# Patient Record
Sex: Female | Born: 2000 | Race: White | Hispanic: No | Marital: Single | Smoking: Never smoker
Health system: Southern US, Community
[De-identification: ages and names within clinical notes are randomized; demographics above are authoritative.]

## PROBLEM LIST (undated history)

## (undated) DIAGNOSIS — D649 Anemia, unspecified: Secondary | ICD-10-CM

## (undated) DIAGNOSIS — M549 Dorsalgia, unspecified: Secondary | ICD-10-CM

## (undated) HISTORY — DX: Dorsalgia, unspecified: M54.9

## (undated) HISTORY — DX: Anemia, unspecified: D64.9

---
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
## 2022-09-20 ENCOUNTER — Ambulatory Visit (INDEPENDENT_AMBULATORY_CARE_PROVIDER_SITE_OTHER): Payer: PRIVATE HEALTH INSURANCE | Admitting: Nurse Practitioner

## 2022-09-20 ENCOUNTER — Encounter: Payer: Self-pay | Admitting: Nurse Practitioner

## 2022-09-20 VITALS — BP 116/76 | HR 81 | Temp 98.5°F | Ht 65.0 in | Wt 246.0 lb

## 2022-09-20 DIAGNOSIS — Z0289 Encounter for other administrative examinations: Secondary | ICD-10-CM

## 2022-09-20 DIAGNOSIS — Z6841 Body Mass Index (BMI) 40.0 and over, adult: Secondary | ICD-10-CM | POA: Diagnosis not present

## 2022-09-20 DIAGNOSIS — R5383 Other fatigue: Secondary | ICD-10-CM | POA: Diagnosis not present

## 2022-09-20 NOTE — Progress Notes (Signed)
Office: [phone]  /  Fax: [phone]   Initial Visit  Jodi Gilbert was seen in clinic today to evaluate for obesity. She is interested in losing weight to improve overall health and reduce the risk of weight related complications. She presents today to review program treatment options, initial physical assessment, and evaluation.     She was referred by: Friend or Family  When asked what else they would like to accomplish? She states: Improve quality of life and Lose a target amount of weight : Goal weight:  <200 lbs  She is going to [employer] in [place].  She works as a Social worker.    Weight history:  She started gaining weight around the age of 45  When asked how has your weight affected you? She states: Having fatigue  Some associated conditions: none  Contributing factors: None  Weight promoting medications identified: None  Current nutrition plan: None  Current level of physical activity: works as a Social worker  Current or previous pharmacotherapy: None  Response to medication: Never tried medications   Past medical history includes:  History reviewed. No pertinent past medical history.   Objective:   BP 116/76   Pulse 81   Temp 98.5 F (36.9 C)   Ht 5\' 5"  (1.651 m)   Wt 246 lb (111.6 kg)   LMP 09/13/2022 (Approximate)   SpO2 96%   BMI 40.94 kg/m  She was weighed on the bioimpedance scale: Body mass index is 40.94 kg/m.  Peak Weight:251 lbs , Body Fat%:43.6%, Visceral Fat Rating:9, Weight trend over the last 12 months: Decreasing  General:  Alert, oriented and cooperative. Patient is in no acute distress.  Respiratory: Normal respiratory effort, no problems with respiration noted   Gait: able to ambulate independently  Mental Status: Normal mood and affect. Normal behavior. Normal judgment and thought content.   DIAGNOSTIC DATA REVIEWED:  BMET No results found for: "NA", "K", "CL", "CO2", "GLUCOSE", "BUN", "CREATININE", "CALCIUM",  "GFRNONAA", "GFRAA" No results found for: "HGBA1C" No results found for: "INSULIN" CBC No results found for: "WBC", "RBC", "HGB", "HCT", "PLT", "MCV", "MCH", "MCHC", "RDW" Iron/TIBC/Ferritin/ %Sat No results found for: "IRON", "TIBC", "FERRITIN", "IRONPCTSAT" Lipid Panel  No results found for: "CHOL", "TRIG", "HDL", "CHOLHDL", "VLDL", "LDLCALC", "LDLDIRECT" Hepatic Function Panel  No results found for: "PROT", "ALBUMIN", "AST", "ALT", "ALKPHOS", "BILITOT", "BILIDIR", "IBILI" No results found for: "TSH"   Assessment and Plan:   Other fatigue  Morbid obesity (HCC)  BMI 40.0-44.9, adult (HCC)        Obesity Treatment / Action Plan:  Patient will work on garnering support from family and friends to begin weight loss journey. Will work on eliminating or reducing the presence of highly palatable, calorie dense foods in the home. Will complete provided nutritional and psychosocial assessment questionnaire before the next appointment. Will be scheduled for indirect calorimetry to determine resting energy expenditure in a fasting state.  This will allow [place] to create a reduced calorie, high-protein meal plan to promote loss of fat mass while preserving muscle mass. Counseled on the health benefits of losing 5%-15% of total body weight. Was counseled on nutritional approaches to weight loss and benefits of reducing processed foods and consuming plant-based foods and high quality protein as part of nutritional weight management. Was counseled on pharmacotherapy and role as an adjunct in weight management.   Obesity Education Performed Today:  She was weighed on the bioimpedance scale and results were discussed and documented in the synopsis.  We discussed obesity  as a disease and the importance of a more detailed evaluation of all the factors contributing to the disease.  We discussed the importance of long term lifestyle changes which include nutrition, exercise and behavioral  modifications as well as the importance of customizing this to her specific health and social needs.  We discussed the benefits of reaching a healthier weight to alleviate the symptoms of existing conditions and reduce the risks of the biomechanical, metabolic and psychological effects of obesity.  Jodi Gilbert appears to be in the action stage of change and states they are ready to start intensive lifestyle modifications and behavioral modifications.  30 minutes was spent today on this visit including the above counseling, pre-visit chart review, and post-visit documentation.  Reviewed by clinician on day of visit: allergies, medications, problem list, medical history, surgical history, family history, social history, and previous encounter notes pertinent to obesity diagnosis.    Theodis Sato Ronith Berti FNP-C

## 2022-09-23 ENCOUNTER — Encounter (INDEPENDENT_AMBULATORY_CARE_PROVIDER_SITE_OTHER): Payer: Self-pay | Admitting: Family Medicine

## 2022-10-07 ENCOUNTER — Encounter: Payer: Self-pay | Admitting: Bariatrics

## 2022-10-07 ENCOUNTER — Ambulatory Visit (INDEPENDENT_AMBULATORY_CARE_PROVIDER_SITE_OTHER): Payer: PRIVATE HEALTH INSURANCE | Admitting: Bariatrics

## 2022-10-07 VITALS — BP 118/70 | HR 70 | Temp 97.9°F | Ht 65.0 in | Wt 246.0 lb

## 2022-10-07 DIAGNOSIS — Z6841 Body Mass Index (BMI) 40.0 and over, adult: Secondary | ICD-10-CM | POA: Diagnosis not present

## 2022-10-07 DIAGNOSIS — E559 Vitamin D deficiency, unspecified: Secondary | ICD-10-CM

## 2022-10-07 DIAGNOSIS — Z0000 Encounter for general adult medical examination without abnormal findings: Secondary | ICD-10-CM

## 2022-10-07 DIAGNOSIS — Z1331 Encounter for screening for depression: Secondary | ICD-10-CM

## 2022-10-07 DIAGNOSIS — R0602 Shortness of breath: Secondary | ICD-10-CM

## 2022-10-07 DIAGNOSIS — R7309 Other abnormal glucose: Secondary | ICD-10-CM | POA: Insufficient documentation

## 2022-10-07 DIAGNOSIS — R5383 Other fatigue: Secondary | ICD-10-CM

## 2022-10-11 ENCOUNTER — Encounter (INDEPENDENT_AMBULATORY_CARE_PROVIDER_SITE_OTHER): Payer: Self-pay | Admitting: Bariatrics

## 2022-10-11 DIAGNOSIS — R7303 Prediabetes: Secondary | ICD-10-CM | POA: Insufficient documentation

## 2022-10-11 NOTE — Progress Notes (Signed)
Chief Complaint:   OBESITY Tierney Wolden (MR# [id number]) is a 22 y.o. female who presents for evaluation and treatment of obesity and related comorbidities. Current BMI is Body mass index is 40.94 kg/m. Suheyla has been struggling with her weight for many years and has been unsuccessful in either losing weight, maintaining weight loss, or reaching her healthy weight goal.  Daphny is currently in the action stage of change and ready to dedicate time achieving and maintaining a healthier weight. Kimi is interested in becoming our patient and working on intensive lifestyle modifications including (but not limited to) diet and exercise for weight loss.  Patient states that she does not really like to cook as she has no time. She crave chocolate. She skips breakfast.   Layloni's habits were reviewed today and are as follows: her desired weight loss is 46 lbs, she has been heavy most of her life, she started gaining weight at 22 years old, her heaviest weight ever was 253 pounds, she has significant food cravings issues, she snacks frequently in the evenings, she skips meals frequently, she is frequently drinking liquids with calories, she frequently makes poor food choices, she frequently eats larger portions than normal, and she struggles with emotional eating.  Depression Screen Catlin's Food and Mood (modified PHQ-9) score was 15.  Subjective:   1. Other fatigue Williette admits to daytime somnolence and admits to waking up still tired. Patient has a history of symptoms of daytime fatigue, morning fatigue, and morning headache. Luwanna generally gets 7 hours of sleep per night, and states that she has generally restful sleep. Snoring is not present. Apneic episodes are not present. Epworth Sleepiness Score is 6.   2. SOB (shortness of breath) on exertion Vanetta notes increasing shortness of breath with exercising and seems to be worsening over time with weight gain. She notes  getting out of breath sooner with activity than she used to. This has not gotten worse recently. Brizeida denies shortness of breath at rest or orthopnea.  3. Elevated glucose Patient denies a family history.   4. Vitamin D deficiency Patient is not on vitamins.   5. Health care maintenance Given Obesity.   Assessment/Plan:   1. Other fatigue Aubriella does feel that her weight is causing her energy to be lower than it should be. Fatigue may be related to obesity, depression or many other causes. Labs will be ordered, and in the meanwhile, Starlet will focus on self care including making healthy food choices, increasing physical activity and focusing on stress reduction.  - EKG 12-Lead - TSH - CBC with Differential/Platelet  2. SOB (shortness of breath) on exertion Lela does feel that she gets out of breath more easily that she used to when she exercises. Rickeya's shortness of breath appears to be obesity related and exercise induced. She has agreed to work on weight loss and gradually increase exercise to treat her exercise induced shortness of breath. Will continue to monitor closely.  - TSH - CBC with Differential/Platelet  3. Elevated glucose We will check labs today, and we will follow-up at patient's next visit.   - Hemoglobin A1c - Insulin, random  4. Vitamin D deficiency We will check labs today, and we will follow-up at patient's next visit.   - VITAMIN D 25 Hydroxy (Vit-D Deficiency, Fractures)  5. Health care maintenance We will check labs today, and we will follow-up at patient's next visit. EKG and IC were done and reviewed with the patient.   -  Hemoglobin A1c - Insulin, random - Vitamin B12 - Lipid Panel With LDL/HDL Ratio - VITAMIN D 25 Hydroxy (Vit-D Deficiency, Fractures) - TSH - Comprehensive metabolic panel - CBC with Differential/Platelet  6. Depression screening Kierstynn had a positive depression screening. Depression is commonly associated with  obesity and often results in emotional eating behaviors. We will monitor this closely and work on CBT to help improve the non-hunger eating patterns. Referral to Psychology may be required if no improvement is seen as she continues in our clinic.  7. Morbid obesity (HCC)  8. BMI 40.0-44.9, adult [facility]) Rusty is currently in the action stage of change and her goal is to continue with weight loss efforts. I recommend Jaselynn begin the structured treatment plan as follows:  She has agreed to the Category 3 Plan.  Patient will minimize meal skipping.   Exercise goals: No exercise has been prescribed at this time.   Behavioral modification strategies: increasing lean protein intake, decreasing simple carbohydrates, increasing vegetables, increasing water intake, decreasing eating out, no skipping meals, meal planning and cooking strategies, keeping healthy foods in the home, and planning for success.  She was informed of the importance of frequent follow-up visits to maximize her success with intensive lifestyle modifications for her multiple health conditions. She was informed we would discuss her lab results at her next visit unless there is a critical issue that needs to be addressed sooner. Manaia agreed to keep her next visit at the agreed upon time to discuss these results.  Objective:   Blood pressure 118/70, pulse 70, temperature 97.9 F (36.6 C), height 5\' 5"  (1.651 m), weight 246 lb (111.6 kg), last menstrual period 09/13/2022, SpO2 98%. Body mass index is 40.94 kg/m.  EKG: Normal sinus rhythm, rate 81 BPM.  Indirect Calorimeter completed today shows a VO2 of 297 and a REE of 2045.  Her calculated basal metabolic rate is 8119 thus her basal metabolic rate is better than expected.  General: Cooperative, alert, well developed, in no acute distress. HEENT: Conjunctivae and lids unremarkable. Cardiovascular: Regular rhythm.  Lungs: Normal work of breathing. Neurologic: No focal  deficits.   Lab Results  Component Value Date   CREATININE 0.72 10/07/2022   BUN 9 10/07/2022   NA 140 10/07/2022   K 4.9 10/07/2022   CL 105 10/07/2022   CO2 21 10/07/2022   Lab Results  Component Value Date   ALT 13 10/07/2022   AST 13 10/07/2022   ALKPHOS 98 10/07/2022   BILITOT 0.3 10/07/2022   Lab Results  Component Value Date   HGBA1C 6.3 (H) 10/07/2022   Lab Results  Component Value Date   INSULIN 26.3 (H) 10/07/2022   Lab Results  Component Value Date   TSH 2.910 10/07/2022   Lab Results  Component Value Date   CHOL 110 10/07/2022   HDL 40 10/07/2022   LDLCALC 56 10/07/2022   TRIG 68 10/07/2022   Lab Results  Component Value Date   WBC 7.5 10/07/2022   HGB 11.0 (L) 10/07/2022   HCT 35.2 10/07/2022   MCV 69 (L) 10/07/2022   PLT 414 10/07/2022   No results found for: "IRON", "TIBC", "FERRITIN"  Attestation Statements:   Reviewed by clinician on day of visit: allergies, medications, problem list, medical history, surgical history, family history, social history, and previous encounter notes.   Trude Mcburney, am acting as Energy manager for [employer], DO.  I have reviewed the above documentation for accuracy and completeness, and I agree with the  above. Corinna Capra, DO

## 2022-10-18 ENCOUNTER — Ambulatory Visit: Payer: Self-pay | Admitting: Physician Assistant

## 2022-10-20 ENCOUNTER — Ambulatory Visit: Payer: PRIVATE HEALTH INSURANCE | Admitting: Bariatrics

## 2022-10-20 VITALS — BP 118/79 | HR 81 | Temp 98.1°F | Ht 65.0 in | Wt 245.0 lb

## 2022-10-20 DIAGNOSIS — R7303 Prediabetes: Secondary | ICD-10-CM

## 2022-10-20 DIAGNOSIS — E559 Vitamin D deficiency, unspecified: Secondary | ICD-10-CM | POA: Diagnosis not present

## 2022-10-20 DIAGNOSIS — Z6841 Body Mass Index (BMI) 40.0 and over, adult: Secondary | ICD-10-CM | POA: Diagnosis not present

## 2022-10-20 MED ORDER — VITAMIN D (ERGOCALCIFEROL) 1.25 MG (50000 UNIT) PO CAPS: 50000.0000 [IU] | ORAL_CAPSULE | ORAL | 0 refills | Status: DC

## 2022-10-26 NOTE — Progress Notes (Signed)
Chief Complaint:   OBESITY Jodi Gilbert is here to discuss her progress with her obesity treatment plan along with follow-up of her obesity related diagnoses. Jodi Gilbert is on the Category 3 Plan and states she is following her eating plan approximately 50% of the time. Jodi Gilbert states she is walking for 30-45 minutes 4 times per week.  Today's visit was #: 2 Starting weight: 246 lbs Starting date: 10/07/2022 Today's weight: 245 lbs Today's date: 10/20/2022 Total lbs lost to date: 1 Total lbs lost since last in-office visit: 1  Interim History: Patient is down 1 pound since her first visit.  She states that it has been hard to eat breakfast.  Subjective:   1. Prediabetes Patient's recent A1c was 6.3 and insulin 26.3.  2. Vitamin D insufficiency Patient's current vitamin D level is 40.3.  Assessment/Plan:   1. Prediabetes Handouts on insulin resistance and prediabetes were given.  Metformin was discussed and GLP-1 medication handout was provided.  We will discuss medication options at her next visit.  Patient will sign up for the cafeteria and and keep healthy snacks in her room at school.  Low carbohydrate snacks were discussed.  2. Vitamin D insufficiency Patient agreed to start prescription vitamin D 50,000 IU once weekly with no refills.  - Vitamin D, Ergocalciferol, (DRISDOL) 1.25 MG (50000 UNIT) CAPS capsule; Take 1 capsule (50,000 Units total) by mouth every 7 (seven) days.  Dispense: 5 capsule; Refill: 0  3. Morbid obesity (HCC)  4. BMI 40.0-44.9, adult [facility]) Jodi Gilbert is currently in the action stage of change. As such, her goal is to continue with weight loss efforts. She has agreed to the Category 3 Plan.   Patient will adhere more closely to the plan.  Mindful eating was discussed.  Reviewed labs with patient from 10/07/2022, CMP, lipids, vitamin D, B12, CBC, A1c, insulin, and TSH.  Exercise goals: As is.   Behavioral modification strategies: increasing lean protein  intake, decreasing simple carbohydrates, increasing vegetables, increasing water intake, decreasing eating out, no skipping meals, meal planning and cooking strategies, keeping healthy foods in the home, and planning for success.  Jodi Gilbert has agreed to follow-up with our clinic in 2 to 3 weeks via virtual visit with Irene Limbo, FNP-C. She was informed of the importance of frequent follow-up visits to maximize her success with intensive lifestyle modifications for her multiple health conditions.   Objective:   Blood pressure 118/79, pulse 81, temperature 98.1 F (36.7 C), height 5\' 5"  (1.651 m), weight 245 lb (111.1 kg), last menstrual period 09/13/2022, SpO2 96%. Body mass index is 40.77 kg/m.  General: Cooperative, alert, well developed, in no acute distress. HEENT: Conjunctivae and lids unremarkable. Cardiovascular: Regular rhythm.  Lungs: Normal work of breathing. Neurologic: No focal deficits.   Lab Results  Component Value Date   CREATININE 0.72 10/07/2022   BUN 9 10/07/2022   NA 140 10/07/2022   K 4.9 10/07/2022   CL 105 10/07/2022   CO2 21 10/07/2022   Lab Results  Component Value Date   ALT 13 10/07/2022   AST 13 10/07/2022   ALKPHOS 98 10/07/2022   BILITOT 0.3 10/07/2022   Lab Results  Component Value Date   HGBA1C 6.3 (H) 10/07/2022   Lab Results  Component Value Date   INSULIN 26.3 (H) 10/07/2022   Lab Results  Component Value Date   TSH 2.910 10/07/2022   Lab Results  Component Value Date   CHOL 110 10/07/2022   HDL 40 10/07/2022  LDLCALC 56 10/07/2022   TRIG 68 10/07/2022   Lab Results  Component Value Date   VD25OH 40.3 10/07/2022   Lab Results  Component Value Date   WBC 7.5 10/07/2022   HGB 11.0 (L) 10/07/2022   HCT 35.2 10/07/2022   MCV 69 (L) 10/07/2022   PLT 414 10/07/2022   No results found for: "IRON", "TIBC", "FERRITIN"  Attestation Statements:   Reviewed by clinician on day of visit: allergies, medications, problem  list, medical history, surgical history, family history, social history, and previous encounter notes.   Trude Mcburney, am acting as Energy manager for [employer], DO.  I have reviewed the above documentation for accuracy and completeness, and I agree with the above. Corinna Capra, DO

## 2022-11-01 ENCOUNTER — Encounter: Payer: Self-pay | Admitting: Bariatrics

## 2022-11-11 ENCOUNTER — Other Ambulatory Visit: Payer: Self-pay | Admitting: Bariatrics

## 2022-11-11 DIAGNOSIS — E559 Vitamin D deficiency, unspecified: Secondary | ICD-10-CM

## 2022-11-15 ENCOUNTER — Other Ambulatory Visit: Payer: Self-pay | Admitting: Bariatrics

## 2022-11-15 DIAGNOSIS — E559 Vitamin D deficiency, unspecified: Secondary | ICD-10-CM

## 2022-11-17 ENCOUNTER — Telehealth: Payer: PRIVATE HEALTH INSURANCE | Admitting: Nurse Practitioner

## 2022-11-17 ENCOUNTER — Encounter: Payer: Self-pay | Admitting: Nurse Practitioner

## 2022-11-17 DIAGNOSIS — E559 Vitamin D deficiency, unspecified: Secondary | ICD-10-CM

## 2022-11-17 DIAGNOSIS — Z6841 Body Mass Index (BMI) 40.0 and over, adult: Secondary | ICD-10-CM

## 2022-11-17 MED ORDER — VITAMIN D (ERGOCALCIFEROL) 1.25 MG (50000 UNIT) PO CAPS: 50000.0000 [IU] | ORAL_CAPSULE | ORAL | 0 refills | Status: DC

## 2022-11-17 NOTE — Progress Notes (Signed)
Office: [phone]  /  Fax: [phone]  WEIGHT SUMMARY AND BIOMETRICS  TeleHealth Visit:  This visit was completed with telemedicine (audio/video) technology. Jodi Gilbert has verbally consented to this TeleHealth visit. The patient is located at home, the provider is located at [facility] [place]. The participants in this visit include the listed provider and patient. The visit was conducted today via MyChart video.   HPI  Chief Complaint: OBESITY  Jodi Gilbert is on video visit to discuss her progress with her obesity treatment plan. She is on the the Category 3 Plan and states she is following her eating plan approximately 50 % of the time. She states she is walking to stay active.    Interval History:  Since last office visit she has started back to school in [place].  She is majoring in [place].  She is living in a dorm.  She is on the dining plan at school  BF:  skips Snack:  no Lunch: frozen meal (300 cals or less-aims for 15-20 grams of protein) with water Snack:  orange, pop corner chips Dinner:  goes to [employer]- bowl with chicken, rice, squash, chick peas  Drinks:  water, denies sugary drinks   Pharmacotherapy for weight loss: She is not currently taking medications  for medical weight loss.    Previous pharmacotherapy for medical weight loss:  none  Bariatric surgery:  Patient has not had bariatric surgery  Vit D deficiency  She is taking Vit D 50,000 IU weekly.  Denies side effects.  Denies nausea, vomiting or muscle weakness.    Lab Results  Component Value Date   VD25OH 40.3 10/07/2022     PHYSICAL EXAM:  There were no vitals taken for this visit. There is no height or weight on file to calculate BMI.  General: She is overweight, cooperative, alert, well developed, and in no acute distress. PSYCH: Has normal mood, affect and thought process.   Extremities: No edema.  Neurologic: No gross sensory or motor deficits. No tremors  or fasciculations noted.    DIAGNOSTIC DATA REVIEWED:  BMET    Component Value Date/Time   NA 140 10/07/2022 0930   K 4.9 10/07/2022 0930   CL 105 10/07/2022 0930   CO2 21 10/07/2022 0930   GLUCOSE 91 10/07/2022 0930   BUN 9 10/07/2022 0930   CREATININE 0.72 10/07/2022 0930   CALCIUM 9.6 10/07/2022 0930   Lab Results  Component Value Date   HGBA1C 6.3 (H) 10/07/2022   Lab Results  Component Value Date   INSULIN 26.3 (H) 10/07/2022   Lab Results  Component Value Date   TSH 2.910 10/07/2022   CBC    Component Value Date/Time   WBC 7.5 10/07/2022 0930   RBC 5.07 10/07/2022 0930   HGB 11.0 (L) 10/07/2022 0930   HCT 35.2 10/07/2022 0930   PLT 414 10/07/2022 0930   MCV 69 (L) 10/07/2022 0930   MCH 21.7 (L) 10/07/2022 0930   MCHC 31.3 (L) 10/07/2022 0930   RDW 16.1 (H) 10/07/2022 0930   Iron Studies No results found for: "IRON", "TIBC", "FERRITIN", "IRONPCTSAT" Lipid Panel     Component Value Date/Time   CHOL 110 10/07/2022 0930   TRIG 68 10/07/2022 0930   HDL 40 10/07/2022 0930   LDLCALC 56 10/07/2022 0930   Hepatic Function Panel     Component Value Date/Time   PROT 6.8 10/07/2022 0930   ALBUMIN 4.3 10/07/2022 0930   AST 13 10/07/2022 0930   ALT  13 10/07/2022 0930   ALKPHOS 98 10/07/2022 0930   BILITOT 0.3 10/07/2022 0930      Component Value Date/Time   TSH 2.910 10/07/2022 0930   Nutritional Lab Results  Component Value Date   VD25OH 40.3 10/07/2022     ASSESSMENT AND PLAN  TREATMENT PLAN FOR OBESITY:  Recommended Dietary Goals  [name] is currently in the action stage of change. As such, her goal is to continue weight management plan. She has agreed to the Category 3 Plan.  Discussed the importance of protein intake-not currently meeting protein goals. Plans to start a protein shake for breakfast.    Behavioral Intervention  We discussed the following Behavioral Modification Strategies today: increasing lean protein intake, decreasing  simple carbohydrates , increasing vegetables, increasing lower glycemic fruits, increasing water intake, work on meal planning and preparation, work on tracking and journaling calories using tracking application, keeping healthy foods at home, continue to practice mindfulness when eating, and planning for success.  Additional resources provided today: NA  Recommended Physical Activity Goals  [name] has been advised to work up to 150 minutes of moderate intensity aerobic activity a week and strengthening exercises 2-3 times per week for cardiovascular health, weight loss maintenance and preservation of muscle mass.   She has agreed to Think about ways to increase daily physical activity and overcoming barriers to exercise and Increase physical activity in their day and reduce sedentary time (increase NEAT).    ASSOCIATED CONDITIONS ADDRESSED TODAY  Action/Plan  Vitamin D insufficiency -     Vitamin D (Ergocalciferol); Take 1 capsule (50,000 Units total) by mouth every 7 (seven) days.  Dispense: 5 capsule; Refill: 0  Morbid obesity (HCC)  BMI 40.0-44.9, adult (HCC)      Will discuss pre diabetes more and Metformin at next visit.     Return in about 4 weeks (around 12/15/2022).Marland Kitchen She was informed of the importance of frequent follow up visits to maximize her success with intensive lifestyle modifications for her multiple health conditions.   ATTESTASTION STATEMENTS:  Reviewed by clinician on day of visit: allergies, medications, problem list, medical history, surgical history, family history, social history, and previous encounter notes.     Theodis Sato. Divit Stipp FNP-C

## 2022-11-18 ENCOUNTER — Other Ambulatory Visit: Payer: Self-pay | Admitting: Nurse Practitioner

## 2022-11-18 DIAGNOSIS — E559 Vitamin D deficiency, unspecified: Secondary | ICD-10-CM

## 2022-11-18 MED ORDER — VITAMIN D (ERGOCALCIFEROL) 1.25 MG (50000 UNIT) PO CAPS: 50000.0000 [IU] | ORAL_CAPSULE | ORAL | 0 refills | Status: DC

## 2022-12-20 ENCOUNTER — Other Ambulatory Visit: Payer: Self-pay | Admitting: Nurse Practitioner

## 2022-12-20 ENCOUNTER — Telehealth (INDEPENDENT_AMBULATORY_CARE_PROVIDER_SITE_OTHER): Payer: PRIVATE HEALTH INSURANCE | Admitting: Nurse Practitioner

## 2022-12-20 DIAGNOSIS — E559 Vitamin D deficiency, unspecified: Secondary | ICD-10-CM

## 2022-12-20 DIAGNOSIS — R632 Polyphagia: Secondary | ICD-10-CM | POA: Diagnosis not present

## 2022-12-20 DIAGNOSIS — R7303 Prediabetes: Secondary | ICD-10-CM | POA: Diagnosis not present

## 2022-12-20 DIAGNOSIS — Z6841 Body Mass Index (BMI) 40.0 and over, adult: Secondary | ICD-10-CM

## 2022-12-20 MED ORDER — VITAMIN D (ERGOCALCIFEROL) 1.25 MG (50000 UNIT) PO CAPS: 50000.0000 [IU] | ORAL_CAPSULE | ORAL | 0 refills | Status: DC

## 2022-12-20 MED ORDER — WEGOVY 0.25 MG/0.5ML ~~LOC~~ SOAJ: 0.2500 mg | SUBCUTANEOUS | 0 refills | Status: DC

## 2022-12-20 NOTE — Patient Instructions (Addendum)
What is a GLP-1 Glucagon like peptide-1 (GLP-1) agonists represent a class of medications used to treat type 2 diabetes mellitus and obesity.  GLP-1 medications mimic the action of a hormone called glucagon like peptide 1.  When blood sugar levels start to rise/increase these drugs stimulate the body to produce more insulin.  When that happens, the extra insulin helps to lower the blood sugar levels in the body.  This in returns helps with decreasing cravings.  These medications also slow the movement of food from the stomach into the small intestine.  This in return helps one to full faster and longer.   Diabetic medications: Approved for treatment of diabetes mellitus but does not have full approval for weight loss use Victoza (liraglutide) Ozempic (semaglutide) Mounjaro Trulicity Rybelsus  Weight loss medications: Approved for long-term weight loss use.        Saxenda (liraglutide) Wegovy (semaglutide) Zepbound Contraindications:  Pancreatitis (active gallstones) Medullary thyroid cancer High triglycerides (>500)-will need labs prior to starting Multiple Endocrine Neoplasia syndrome type 2 (MEN 2) Trying to get pregnant Breastfeeding Use with caution with taking insulin or sulfonylureas (will need to monitor blood sugars for hypoglycemia) Side effects (most common): Most common side effects are nausea, gas, bloating and constipation.  Other possible side effects are headaches, belching, diarrhea, tiredness (fatigue), vomiting, upset stomach, dizziness, heartburn and stomach (abdominal pain).  If you think that you are becoming dehydrated, please inform our office or your primary family provider.  Stop immediately and go to ER if you have any symptoms of a serious allergic reaction including swelling of your face, lips, tongue or throat; problems breathing or swallowing; severe rash or itching; fainting or feeling dizzy; or very rapid heart rate.                                                                                          Steps to starting your [facility]  The office staff will send a prior authorization request to your insurance company for approval. We will send you a mychart message once we hear back from your insurance with a decision.  This can take up to 7-10 business days.   Once your WegovyTis approved, you may then pick up [facility] pen from your pharmacy.    Learn how to do Wegovy injections on the [email or web address] website. There is a training video that will walk you through how to safely perform the injection. If you have questions for our clinical staff, please contact our  clinical staff. If you have any symptoms of allergic reaction to [facility] discontinue immediately and call 911.  1. What should I tell my provider before using WegovyT ? have or have had problems with your pancreas or kidneys. have type 2 diabetes and a history of diabetic retinopathy. have or have had depression, suicidal thoughts, or mental health issues. are pregnant or plan to become pregnant. Jodi Gilbert may harm your unborn baby. You should stop using WegovyT 3 months before you plan to become pregnant or if you are breastfeeding or plan to breastfeed. It is not known if WegovyT passes into your breast milk.  2. What is [place] and  how does it work?  Jodi Gilbert is an injectable prescription medication prescribed by your provider to help with your weight loss.  This medicine will be most effective when combined with a reduced calorie diet and physical activity.  Jodi Gilbert is not for the treatment of type 2 diabetes mellitus. Jodi Gilbert should not be used with other GLP-1 receptor agonist medicines. The addition of WegovyT in  patients treated with insulin has not been evaluated. When initiating WegovyT, consider reducing the dose of concomitantly administered insulin secretagogues (such as sulfonylureas) or insulin to reduce the risk of  hypoglycemia.  One role of GLP-1 is to send a signal to your  brain to tell it you are full. It also slows down stomach emptying which will make you feel full longer and may help with reducing cravings.   3.  How should I take WegovyT?  Administer WegovyT once weekly, on the same day each week, at any time of day, with or without meals Inject subcutaneously in the abdomen, thigh or upper arm Initiate at 0.25 mg once weekly for 4 weeks. In 4 week intervals, increase the dose until a dose of 2.4 mg is reached (we will discuss with you the dosage at each visit). The maintenance dose of WegovyT is 2.4 mg once weekly.  The dosing schedule of Wegovy is:  0.25 mg per week X 4 weeks 0.5 mg per week X 4 weeks 1.0 mg per week X 4 weeks 1.7 mg per week X 4 weeks 2.4 mg per week   Missed dose   If you miss your injection day, go ahead inject your current dose. You can go >7 days, but not <7 days between injections. You may change your injection day (It must be >7 days). If you miss >2 doses, you can still keep next injection dose the same or follow de-escalation schedule which may minimize GI symptoms.   In patients with type 2 diabetes, monitor blood glucose prior to starting and during WEGOVYT treatment.   Inject your dose of Wegovy under the skin (subcutaneous injection) in your stomach area (abdomen), upper leg (thigh) or upper arm. Do not inject into a vein or a muscle. The injection site should be rotated and not given in the same spot each day. Hold the needle under the skin and count to "10". This will allow all of the medicine to be dispensed under the skin. Always wipe your skin with an alcohol prep pad before injection  Dispose of used pen in an approved sharps container. More practical options that can be put in the trash  to go to the landfill are milk jugs or plastic laundry detergent containers with a screw on lid.  What side effects may I notice from taking WegovyT?  Side effects that usually do not require medical attention (report to our  office if they continue or are bothersome): Nausea (most common but decreases over time in most people as their body gets used to the medicine) Diarrhea Constipation (you may take an over the counter laxative if needed) Headache Decreased appetite Upset stomach Tiredness Dizziness Feeling bloated Hair loss Belching Gas Heartburn  Side effects that you should call 911 as soon as possible Vomiting Stomach pain Fever Yellowing of your skin or eyes  Clay-colored stools Increased heart rate while at rest Low blood sugar  Sudden changes in mood, behaviors, thoughts, feelings, or thoughts of suicide If you get a lump or swelling in your neck, hoarseness, trouble swallowing, or shortness of breath. Allergic  reaction such as skin rash, itching, hives, swelling of the face, tongue, or lips  Helpful tips for managing nausea Nausea is a common side effect when first starting WegovyT. If you experience nausea, be sure to connect with your health care provider. He or she will offer guidance on ways to manage it, which may include: Eat bland, low-fat foods, like crackers, toast and rice  Eat foods that contain water, like soups and gelatin  Avoid lying down after you eat  Go outdoors for fresh air  Eat more slowly    Other important information Do not drop your pen or knock it against hard surfaces  Do not expose your pen to any liquids  If you think that your pen may be damaged, do not try to fix it. Use a new one Keep the pen cap on until you are ready to inject. Your pen will no longer be sterile if you store an unused pen without the cap, if you pull the pen cap off and put it on again, or if the pen cap is missing. This could lead to an infection  Store the [place] pen in the refrigerator from 109F to 52F (2C to 8C) If needed, before removing the pen cap, WegovyT can be stored from 8C to 30C (52F to 65F) in the original carton for up to 28 days.  Keep WegovyT in the original  carton to protect it from light  Do not freeze  Throw away pen if WegovyT has been frozen, has been exposed to light or temperatures above 65F (30C), or has been out of the refrigerator for 28 days or longer It's important to properly dispose of your used WegovyT pens. Do not throw the pen away in your household trash. Instead, use an FDA-cleared sharps disposable container or a sturdy household container with a tight-fitting lid, like a heavy duty plastic container.   BMWUXL pen training website: [employer]  Wegovy savings and support link: [employer]gone.com

## 2022-12-20 NOTE — Progress Notes (Signed)
Office: [phone]  /  Fax: [phone]  WEIGHT SUMMARY AND BIOMETRICS  TeleHealth Visit:  This visit was completed with telemedicine (audio/video) technology. Jodi Gilbert has verbally consented to this TeleHealth visit. The patient is located at her dorm at [employer], the provider is located at [facility] in [place]. The participants in this visit include the listed provider and patient. The visit was conducted today via MyChart video.   HPI  Chief Complaint: OBESITY  Jodi Gilbert is here to discuss her progress with her obesity treatment plan. She is on the the Category 3 Plan and states it's hard to follow due to being on the dining plan at school. She is walking at school.     Interval History:  She doesn't have a current weight.  Doesn't have a scale at school. Struggling with polyphagia and cravings.  She is walking to stay active.  She plans to sign up for a PE class next semester.    Pharmacotherapy for weight loss: She is not currently taking medications  for medical weight loss.     Vit D deficiency  She is taking Vit D 50,000 IU weekly.  Denies side effects.  Denies nausea, vomiting or muscle weakness.    Lab Results  Component Value Date   VD25OH 40.3 10/07/2022    Prediabetes Last A1c was 6.3  Medication(s): not currently on meds Polyphagia:Yes Lab Results  Component Value Date   HGBA1C 6.3 (H) 10/07/2022   Lab Results  Component Value Date   INSULIN 26.3 (H) 10/07/2022    PHYSICAL EXAM:  There were no vitals taken for this visit. There is no height or weight on file to calculate BMI.  General: She is overweight, cooperative, alert, well developed, and in no acute distress. PSYCH: Has normal mood, affect and thought process.   Extremities: No edema.  Neurologic: No gross sensory or motor deficits. No tremors or fasciculations noted.    DIAGNOSTIC DATA REVIEWED:  BMET    Component Value Date/Time   NA 140 10/07/2022 0930   K 4.9 10/07/2022 0930    CL 105 10/07/2022 0930   CO2 21 10/07/2022 0930   GLUCOSE 91 10/07/2022 0930   BUN 9 10/07/2022 0930   CREATININE 0.72 10/07/2022 0930   CALCIUM 9.6 10/07/2022 0930   Lab Results  Component Value Date   HGBA1C 6.3 (H) 10/07/2022   Lab Results  Component Value Date   INSULIN 26.3 (H) 10/07/2022   Lab Results  Component Value Date   TSH 2.910 10/07/2022   CBC    Component Value Date/Time   WBC 7.5 10/07/2022 0930   RBC 5.07 10/07/2022 0930   HGB 11.0 (L) 10/07/2022 0930   HCT 35.2 10/07/2022 0930   PLT 414 10/07/2022 0930   MCV 69 (L) 10/07/2022 0930   MCH 21.7 (L) 10/07/2022 0930   MCHC 31.3 (L) 10/07/2022 0930   RDW 16.1 (H) 10/07/2022 0930   Iron Studies No results found for: "IRON", "TIBC", "FERRITIN", "IRONPCTSAT" Lipid Panel     Component Value Date/Time   CHOL 110 10/07/2022 0930   TRIG 68 10/07/2022 0930   HDL 40 10/07/2022 0930   LDLCALC 56 10/07/2022 0930   Hepatic Function Panel     Component Value Date/Time   PROT 6.8 10/07/2022 0930   ALBUMIN 4.3 10/07/2022 0930   AST 13 10/07/2022 0930   ALT 13 10/07/2022 0930   ALKPHOS 98 10/07/2022 0930   BILITOT 0.3 10/07/2022 0930      Component Value Date/Time  TSH 2.910 10/07/2022 0930   Nutritional Lab Results  Component Value Date   VD25OH 40.3 10/07/2022     ASSESSMENT AND PLAN  TREATMENT PLAN FOR OBESITY:  Recommended Dietary Goals  Jodi Gilbert is currently in the action stage of change. As such, her goal is to continue weight management plan. She has agreed to practicing portion control and making smarter food choices, such as increasing vegetables and decreasing simple carbohydrates.  Behavioral Intervention  We discussed the following Behavioral Modification Strategies today: continue to work on maintaining a reduced calorie state, getting the recommended amount of protein, incorporating whole foods, making healthy choices, staying well hydrated and practicing mindfulness when  eating..  Additional resources provided today: NA  Recommended Physical Activity Goals  Jodi Gilbert has been advised to work up to 150 minutes of moderate intensity aerobic activity a week and strengthening exercises 2-3 times per week for cardiovascular health, weight loss maintenance and preservation of muscle mass.   She has agreed to Think about enjoyable ways to increase daily physical activity and overcoming barriers to exercise and Increase physical activity in their day and reduce sedentary time (increase NEAT).   Pharmacotherapy We discussed various medication options to help Jodi Gilbert with her weight loss efforts and we both agreed to start [facility] 0.25mg .  Side effects discussed. Discussed the importance of not getting pregnant or alcohol use while taking Wegovy.  Discussed the risks of birth defects and pancreatitis.  Patient verbalizes understanding.   ASSOCIATED CONDITIONS ADDRESSED TODAY  Action/Plan  Vitamin D insufficiency -     Continue Vitamin D (Ergocalciferol); Take 1 capsule (50,000 Units total) by mouth every 7 (seven) days.  Dispense: 5 capsule; Refill: 0  Prediabetes Jodi Gilbert will continue to work on weight loss, exercise, and decreasing simple carbohydrates to help decrease the risk of diabetes.    Start [employer].    Will recheck labs in November.    Polyphagia Start [place]; Inject 0.25 mg into the skin once a week.  Dispense: 2 mL; Refill: 0  Morbid obesity (HCC) -     start WUJWJX; Inject 0.25 mg into the skin once a week.  Dispense: 2 mL; Refill: 0  BMI 40.0-44.9, adult (HCC)     Will repeat labs in [place] after 01/07/23-Vit D, CMP and A1c.  Patient to send me a message via mychart concerning labs.      Return in about 4 weeks (around 01/17/2023).Marland Kitchen She was informed of the importance of frequent follow up visits to maximize her success with intensive lifestyle modifications for her multiple health conditions.   ATTESTASTION STATEMENTS:  Reviewed by  clinician on day of visit: allergies, medications, problem list, medical history, surgical history, family history, social history, and previous encounter notes.      Theodis Sato. Poseidon Pam FNP-C

## 2022-12-21 ENCOUNTER — Telehealth: Payer: Self-pay

## 2022-12-21 NOTE — Telephone Encounter (Signed)
[name] is not a covered medication under patients plan. Please change to another medication: Phentermine is preferred

## 2023-02-02 ENCOUNTER — Ambulatory Visit: Payer: PRIVATE HEALTH INSURANCE | Admitting: Bariatrics

## 2023-02-02 ENCOUNTER — Encounter: Payer: Self-pay | Admitting: Bariatrics

## 2023-02-02 VITALS — BP 121/79 | HR 65 | Temp 98.0°F | Ht 65.0 in | Wt 238.0 lb

## 2023-02-02 DIAGNOSIS — R632 Polyphagia: Secondary | ICD-10-CM

## 2023-02-02 DIAGNOSIS — E6609 Other obesity due to excess calories: Secondary | ICD-10-CM

## 2023-02-02 DIAGNOSIS — E669 Obesity, unspecified: Secondary | ICD-10-CM

## 2023-02-02 DIAGNOSIS — Z6839 Body mass index (BMI) 39.0-39.9, adult: Secondary | ICD-10-CM

## 2023-02-02 DIAGNOSIS — R7303 Prediabetes: Secondary | ICD-10-CM | POA: Diagnosis not present

## 2023-02-02 MED ORDER — LOMAIRA 8 MG PO TABS: ORAL_TABLET | ORAL | 0 refills | Status: DC

## 2023-02-02 NOTE — Progress Notes (Signed)
WEIGHT SUMMARY AND BIOMETRICS  Weight Lost Since Last Visit: 7lb  Weight Gained Since Last Visit: 0lb   Vitals Temp: 98 F (36.7 C) BP: 121/79 Pulse Rate: 65 SpO2: 97 %   Anthropometric Measurements Height: 5\' 5"  (1.651 m) Weight: 238 lb (108 kg) BMI (Calculated): 39.61 Weight at Last Visit: 245lb Weight Lost Since Last Visit: 7lb Weight Gained Since Last Visit: 0lb Starting Weight: 246lb Total Weight Loss (lbs): 8 lb (3.629 kg)   Body Composition  Body Fat %: 44.9 % Fat Mass (lbs): 107 lbs Muscle Mass (lbs): 124.8 lbs Total Body Water (lbs): 89.8 lbs Visceral Fat Rating : 10   Other Clinical Data Fasting: Yes Labs: No Today's Visit #: 6 Starting Date: 10/07/22    OBESITY Jodi Gilbert is here to discuss her progress with her obesity treatment plan along with follow-up of her obesity related diagnoses.    Nutrition Plan: practicing portion control and making smarter food choices, such as increasing vegetables and decreasing simple carbohydrates - 50% adherence.  Current exercise: walking  Interim History:  She is down another  7 lbs since her last visit.  Eating all of the food on the plan., Protein intake is as prescribed, Is skipping meals, Water intake is adequate., Denies polyphagia, and Denies excessive cravings.   Pharmacotherapy: Jodi Gilbert is on no anti-obesity medications. Jodi Gilbert was prescribed and denied.  Hunger is moderately controlled except in the Cravings are moderately controlled.  Assessment/Plan:   Polyphagia Jodi Gilbert endorses excessive hunger.  Medication(s): none Effects of medication:  moderately controlled. Cravings are moderately controlled.   Plan: Medication(s):Rx:  [name] 8 mg by mouth  at noon.  Will increase water, protein and fiber to help assuage hunger.  Will start [name] today. Checked the PDMP, and controlled  medication sheet for [facility], reviewed and signed with the patient. She denies contraindications. Benefits and risks were discussed.   Prediabetes Last A1c was 6.3  Medication(s): none Lab Results  Component Value Date   HGBA1C 6.3 (H) 10/07/2022   Lab Results  Component Value Date   INSULIN 26.3 (H) 10/07/2022    Plan: Will minimize all refined carbohydrates both sweets and starches.  Will work on the plan and exercise.  Consider both aerobic and resistance training.  Will keep protein, water, and fiber intake high.  Increase Polyunsaturated and Monounsaturated fats to increase satiety and encourage weight loss.  Start [name] 8 mg by mouth  at noon.       Generalized Obesity: Current BMI BMI (Calculated): 39.61   Pharmacotherapy Plan Start  [name] 8 mg by mouth  at noon  Jodi Gilbert is currently in the action stage of change. As such, her goal is to continue with weight loss efforts.  She has agreed to practicing portion control and making smarter food choices, such as increasing vegetables and decreasing simple carbohydrates.  Exercise goals: For substantial health  benefits, adults should do at least 150 minutes (2 hours and 30 minutes) a week of moderate-intensity, or 75 minutes (1 hour and 15 minutes) a week of vigorous-intensity aerobic physical activity, or an equivalent combination of moderate- and vigorous-intensity aerobic activity. Aerobic activity should be performed in episodes of at least 10 minutes, and preferably, it should be spread throughout the week.  Behavioral modification strategies: increasing lean protein intake, decreasing simple carbohydrates , no meal skipping, meal planning , increase water intake, better snacking choices, planning for success, decrease junk food, get rid of junk food in the home, decrease snacking , avoiding temptations, and mindful eating.  Jodi Gilbert has agreed to follow-up with our clinic in 4 weeks, and will get fasting labs.       Objective:   VITALS: Per patient if applicable, see vitals. GENERAL: Alert and in no acute distress. CARDIOPULMONARY: No increased WOB. Speaking in clear sentences.  PSYCH: Pleasant and cooperative. Speech normal rate and rhythm. Affect is appropriate. Insight and judgement are appropriate. Attention is focused, linear, and appropriate.  NEURO: Oriented as arrived to appointment on time with no prompting.   Attestation Statements:    This was prepared with the assistance of Engineer, civil (consulting).  Occasional wrong-word or sound-a-like substitutions may have occurred due to the inherent limitations of voice recognition  Jodi Capra, DO

## 2023-03-10 ENCOUNTER — Ambulatory Visit: Payer: No Typology Code available for payment source | Admitting: Bariatrics

## 2023-03-14 ENCOUNTER — Ambulatory Visit: Payer: PRIVATE HEALTH INSURANCE | Admitting: Bariatrics

## 2023-03-14 ENCOUNTER — Encounter (INDEPENDENT_AMBULATORY_CARE_PROVIDER_SITE_OTHER): Payer: Self-pay

## 2023-03-15 ENCOUNTER — Encounter: Payer: Self-pay | Admitting: Bariatrics

## 2023-03-15 ENCOUNTER — Ambulatory Visit (INDEPENDENT_AMBULATORY_CARE_PROVIDER_SITE_OTHER): Payer: PRIVATE HEALTH INSURANCE | Admitting: Bariatrics

## 2023-03-15 VITALS — BP 118/77 | HR 81 | Temp 97.7°F | Ht 65.0 in | Wt 240.0 lb

## 2023-03-15 DIAGNOSIS — Z6839 Body mass index (BMI) 39.0-39.9, adult: Secondary | ICD-10-CM

## 2023-03-15 DIAGNOSIS — E559 Vitamin D deficiency, unspecified: Secondary | ICD-10-CM

## 2023-03-15 DIAGNOSIS — R632 Polyphagia: Secondary | ICD-10-CM

## 2023-03-15 DIAGNOSIS — D509 Iron deficiency anemia, unspecified: Secondary | ICD-10-CM | POA: Diagnosis not present

## 2023-03-15 DIAGNOSIS — R7303 Prediabetes: Secondary | ICD-10-CM

## 2023-03-15 DIAGNOSIS — E66812 Obesity, class 2: Secondary | ICD-10-CM

## 2023-03-15 DIAGNOSIS — E669 Obesity, unspecified: Secondary | ICD-10-CM

## 2023-03-15 MED ORDER — LOMAIRA 8 MG PO TABS: ORAL_TABLET | ORAL | 0 refills | Status: DC

## 2023-03-15 MED ORDER — VITAMIN D (ERGOCALCIFEROL) 1.25 MG (50000 UNIT) PO CAPS: 50000.0000 [IU] | ORAL_CAPSULE | ORAL | 0 refills | Status: DC

## 2023-03-15 NOTE — Progress Notes (Signed)
 WEIGHT SUMMARY AND BIOMETRICS  Weight Lost Since Last Visit: 0  Weight Gained Since Last Visit: 2lb   Vitals Temp: 97.7 F (36.5 C) BP: 118/77 Pulse Rate: 81 SpO2: 99 %   Anthropometric Measurements Height: 5' 5 (1.651 m) Weight: 240 lb (108.9 kg) BMI (Calculated): 39.94 Weight at Last Visit: 238lb Weight Lost Since Last Visit: 0 Weight Gained Since Last Visit: 2lb Starting Weight: 246lb Total Weight Loss (lbs): 6 lb (2.722 kg)   Body Composition  Body Fat %: 45.5 % Fat Mass (lbs): 109.4 lbs Muscle Mass (lbs): 124.6 lbs Total Body Water (lbs): 89.4 lbs Visceral Fat Rating : 10   Other Clinical Data Fasting: yes Labs: yes Today's Visit #: 7 Starting Date: 10/07/22    OBESITY Jodi Gilbert is here to discuss her progress with her obesity treatment plan along with follow-up of her obesity related diagnoses.    Nutrition Plan: practicing portion control and making smarter food choices, such as increasing vegetables and decreasing simple carbohydrates - 60% adherence.  Current exercise: walking  Interim History:  She is up 2 lbs since her last visit.  Protein intake is as prescribed, Is skipping meals, and Water intake is adequate.   Pharmacotherapy: Jodi Gilbert is on Lomairia 8mg  Adverse side effects: None Hunger is moderately controlled.  Cravings are moderately controlled.  Assessment/Plan:   Vitamin D  Deficiency Vitamin D  is not at goal of 50.  Most recent vitamin D  level was 40.3. She is on  prescription ergocalciferol  50,000 IU weekly. Lab Results  Component Value Date   VD25OH 40.3 10/07/2022    Plan: Refill prescription vitamin D  50,000 IU weekly.   Prediabetes Last A1c was 6.3  Medication(s): none Lab Results  Component Value Date   HGBA1C 6.3 (H) 10/07/2022   Lab Results  Component Value Date   INSULIN  26.3 (H) 10/07/2022     Plan: Will minimize all refined carbohydrates both sweets and starches.  Will work on the plan and exercise.  Consider both aerobic and resistance training.  Will keep protein, water, and fiber intake high.  Increase Polyunsaturated and Monounsaturated fats to increase satiety and encourage weight loss.  Aim for 7 to 9 hours of sleep nightly.  Will continue medications.   Continue and refill Jodi Gilbert  8 mg by mouth  daily at noon.     Polyphagia Jodi Gilbert endorses excessive hunger.  Medication(s) Jodi Gilbert  Effects of medication:  moderately controlled. Cravings are moderately controlled.   Plan: Medication(s): Jodi Gilbert  8 mg by mouth  at noon Will increase water, protein and fiber to help assuage hunger.  Will minimize foods that have a high glucose index/load to minimize reactive hypoglycemia.   Iron  deficiency anemia:   She is not taking iron  at this time.  She has a history of anemia per patient as a younger adolescent and also her most recent labs showed some mild anemia.  She states that her cycles are irregular but not necessarily heavy.  Plan: Will obtain anemia panel and ferritin levels.  Labs done today (CMP, Lipids, HgbA1c, insulin , vitamin D , ferritin and anemia panel).    Generalized Obesity: Current BMI BMI (Calculated): 39.94   Pharmacotherapy Plan Continue and refill  Jodi Gilbert  8 mg by mouth  at noon  Jodi Gilbert is currently in the action stage of change. As such, her goal is to continue with weight loss efforts.  She has agreed to practicing portion control and making smarter food choices, such as increasing vegetables and decreasing simple carbohydrates.  Exercise goals: For substantial health benefits, adults should do at least 150 minutes (2 hours and 30 minutes) a week of moderate-intensity, or 75 minutes (1 hour and 15 minutes) a week of vigorous-intensity aerobic physical activity, or an equivalent combination of moderate- and vigorous-intensity aerobic activity.  Aerobic activity should be performed in episodes of at least 10 minutes, and preferably, it should be spread throughout the week.  Behavioral modification strategies: increasing lean protein intake, meal planning , increase water intake, better snacking choices, planning for success, increasing vegetables, and keep healthy foods in the home.  Jodi Gilbert has agreed to follow-up with our clinic in 4 weeks.   No orders of the defined types were placed in this encounter.   Medications Discontinued During This Encounter  Medication Reason   Semaglutide -Weight Management (WEGOVY ) 0.25 MG/0.5ML SOAJ Patient Preference     No orders of the defined types were placed in this encounter.     Objective:   VITALS: Per patient if applicable, see vitals. GENERAL: Alert and in no acute distress. CARDIOPULMONARY: No increased WOB. Speaking in clear sentences.  PSYCH: Pleasant and cooperative. Speech normal rate and rhythm. Affect is appropriate. Insight and judgement are appropriate. Attention is focused, linear, and appropriate.  NEURO: Oriented as arrived to appointment on time with no prompting.   Attestation Statements:   his was prepared with the assistance of Engineer, Civil (consulting).  Occasional wrong-word or sound-a-like substitutions may have occurred due to the inherent limitations of voice recognition   Clayborne Daring, DO

## 2023-03-19 LAB — COMPREHENSIVE METABOLIC PANEL
ALT: 15 [IU]/L (ref 0–32)
AST: 10 [IU]/L (ref 0–40)
Albumin: 4.4 g/dL (ref 4.0–5.0)
Alkaline Phosphatase: 105 [IU]/L (ref 42–106)
BUN/Creatinine Ratio: 19 (ref 9–23)
BUN: 12 mg/dL (ref 6–20)
Bilirubin Total: 0.2 mg/dL (ref 0.0–1.2)
CO2: 21 mmol/L (ref 20–29)
Calcium: 9.2 mg/dL (ref 8.7–10.2)
Chloride: 103 mmol/L (ref 96–106)
Creatinine, Ser: 0.62 mg/dL (ref 0.57–1.00)
Globulin, Total: 2.3 g/dL (ref 1.5–4.5)
Glucose: 97 mg/dL (ref 70–99)
Potassium: 4.7 mmol/L (ref 3.5–5.2)
Sodium: 139 mmol/L (ref 134–144)
Total Protein: 6.7 g/dL (ref 6.0–8.5)
eGFR: 131 mL/min/{1.73_m2} (ref >59)

## 2023-03-19 LAB — ANEMIA PANEL
Ferritin: 22 ng/mL (ref 15–150)
Folate, Hemolysate: 275 ng/mL
Folate, RBC: 733 ng/mL (ref >498)
Hematocrit: 37.5 % (ref 34.0–46.6)
Iron Saturation: 6 % — CL (ref 15–55)
Iron: 20 ug/dL — ABNORMAL LOW (ref 27–159)
Retic Ct Pct: 1.5 % (ref 0.6–2.6)
Total Iron Binding Capacity: 335 ug/dL (ref 250–450)
UIBC: 315 ug/dL (ref 131–425)
Vitamin B-12: 587 pg/mL (ref 232–1245)

## 2023-03-19 LAB — INSULIN, RANDOM: INSULIN: 38.6 u[IU]/mL — ABNORMAL HIGH (ref 2.6–24.9)

## 2023-03-19 LAB — HEMOGLOBIN A1C
Est. average glucose Bld gHb Est-mCnc: 120 mg/dL
Hgb A1c MFr Bld: 5.8 % — ABNORMAL HIGH (ref 4.8–5.6)

## 2023-03-19 LAB — VITAMIN D 25 HYDROXY (VIT D DEFICIENCY, FRACTURES): Vit D, 25-Hydroxy: 35 ng/mL (ref 30.0–100.0)

## 2023-03-21 ENCOUNTER — Telehealth: Payer: Self-pay

## 2023-03-21 ENCOUNTER — Other Ambulatory Visit: Payer: Self-pay | Admitting: Bariatrics

## 2023-03-21 MED ORDER — POLYSACCHARIDE IRON COMPLEX 150 MG PO CAPS: 150.0000 mg | ORAL_CAPSULE | Freq: Every day | ORAL | 1 refills | Status: AC

## 2023-03-21 NOTE — Telephone Encounter (Signed)
 Left message for patient to return call regarding lab results. Will send MyChart message as well.

## 2023-03-21 NOTE — Telephone Encounter (Signed)
-----   Message from Clayborne Daring sent at 03/21/2023  1:32 PM EST ----- Regarding: iron  levels. Her iron  levels are very low. I sent her a prescription to [facility] in [place],   with 1 refill. We can check her iron  in about 6 weeks. ----- Message ----- From: Rebecka Memos Lab Results In Sent: 03/19/2023   5:37 AM EST To: Clayborne DELENA Daring, DO

## 2023-04-13 ENCOUNTER — Telehealth: Payer: PRIVATE HEALTH INSURANCE | Admitting: Nurse Practitioner

## 2023-04-26 ENCOUNTER — Other Ambulatory Visit: Payer: Self-pay | Admitting: Bariatrics

## 2023-04-29 ENCOUNTER — Other Ambulatory Visit: Payer: Self-pay | Admitting: Bariatrics

## 2023-05-12 ENCOUNTER — Ambulatory Visit: Payer: PRIVATE HEALTH INSURANCE | Admitting: Nurse Practitioner

## 2023-05-12 ENCOUNTER — Encounter: Payer: Self-pay | Admitting: Nurse Practitioner

## 2023-05-12 VITALS — BP 128/83 | HR 62 | Temp 98.1°F | Ht 65.0 in | Wt 239.0 lb

## 2023-05-12 DIAGNOSIS — E88819 Insulin resistance, unspecified: Secondary | ICD-10-CM

## 2023-05-12 DIAGNOSIS — Z6839 Body mass index (BMI) 39.0-39.9, adult: Secondary | ICD-10-CM | POA: Diagnosis not present

## 2023-05-12 DIAGNOSIS — R7303 Prediabetes: Secondary | ICD-10-CM

## 2023-05-12 DIAGNOSIS — E66812 Obesity, class 2: Secondary | ICD-10-CM

## 2023-05-12 DIAGNOSIS — D509 Iron deficiency anemia, unspecified: Secondary | ICD-10-CM | POA: Diagnosis not present

## 2023-05-12 MED ORDER — METFORMIN HCL 500 MG PO TABS: 500.0000 mg | ORAL_TABLET | Freq: Every day | ORAL | 1 refills | Status: DC

## 2023-05-12 MED ORDER — LOMAIRA 8 MG PO TABS: ORAL_TABLET | ORAL | 1 refills | Status: DC

## 2023-05-12 NOTE — Progress Notes (Signed)
 Office: [phone]  /  Fax: [phone]  WEIGHT SUMMARY AND BIOMETRICS  Weight Lost Since Last Visit: 1lb  Weight Gained Since Last Visit: 0lb   Vitals Temp: 98.1 F (36.7 C) BP: 128/83 Pulse Rate: 62 SpO2: 98 %   Anthropometric Measurements Height: 5\' 5"  (1.651 m) Weight: 239 lb (108.4 kg) BMI (Calculated): 39.77 Weight at Last Visit: 240lb Weight Lost Since Last Visit: 1lb Weight Gained Since Last Visit: 0lb Starting Weight: 246lb Total Weight Loss (lbs): 7 lb (3.175 kg)   Body Composition  Body Fat %: 44.6 % Fat Mass (lbs): 106.6 lbs Muscle Mass (lbs): 126 lbs Total Body Water (lbs): 90.6 lbs Visceral Fat Rating : 9   Other Clinical Data Fasting: Yes Labs: No Today's Visit #: 8 Starting Date: 10/07/22     HPI  Chief Complaint: OBESITY  Jodi Gilbert is here to discuss her progress with her obesity treatment plan. She is on the practicing portion control and making smarter food choices, such as increasing vegetables and decreasing simple carbohydrates and states she is following her eating plan approximately 65 % of the time. She states she is exercising 20-30 minutes 5 days per week.   Interval History:  Since last office visit she has lost 1 pound.  She is home from college for spring break.  She feels that things are not great due to not seeing more weight loss. She is staying active by walking to class.  She is sleeping 7 hours per day and wakes up feeling refreshed. Notes cravings and polyphagia.       BF:  skips Snack:  none Lunch:  frozen meal or protein, mac and cheese and green beans with sometimes a dessert Snack:  chips and salsa Dinner:  chicken with black beans or chick fil a (sandwich with fries)  Snack:  none Drink:  water   Pharmacotherapy for weight loss: She is currently taking [name] 8mg  at noon for medical weight loss.  Denies side effects.  Denies chest pain, SHOB or palpitations.    Previous pharmacotherapy for medical weight  loss:  None  Unable to take GLP-1 due to cost  Bariatric surgery:  Patient has not had bariatric surgery.    Iron def anemia Taking iron but not on a regular basis.  Reports fatigue.   Prediabetes Last A1c was 5.8  Medication(s): none Polyphagia:Yes FH:  none Lab Results  Component Value Date   HGBA1C 5.8 (H) 03/17/2023   HGBA1C 6.3 (H) 10/07/2022   Lab Results  Component Value Date   INSULIN 38.6 (H) 03/17/2023   INSULIN 26.3 (H) 10/07/2022   Insulin Resistance Last fasting insulin was 38.6. A1c was 5.8. Polyphagia:Yes Medication(s): None  Lab Results  Component Value Date   HGBA1C 5.8 (H) 03/17/2023   HGBA1C 6.3 (H) 10/07/2022   Lab Results  Component Value Date   INSULIN 38.6 (H) 03/17/2023   INSULIN 26.3 (H) 10/07/2022       PHYSICAL EXAM:  Blood pressure 128/83, pulse 62, temperature 98.1 F (36.7 C), height 5\' 5"  (1.651 m), weight 239 lb (108.4 kg), last menstrual period 04/08/2023, SpO2 98%. Body mass index is 39.77 kg/m.  General: She is overweight, cooperative, alert, well developed, and in no acute distress. PSYCH: Has normal mood, affect and thought process.   Extremities: No edema.  Neurologic: No gross sensory or motor deficits. No tremors or fasciculations noted.    DIAGNOSTIC DATA REVIEWED:  BMET    Component Value Date/Time   NA 139 03/17/2023  0849   K 4.7 03/17/2023 0849   CL 103 03/17/2023 0849   CO2 21 03/17/2023 0849   GLUCOSE 97 03/17/2023 0849   BUN 12 03/17/2023 0849   CREATININE 0.62 03/17/2023 0849   CALCIUM 9.2 03/17/2023 0849   Lab Results  Component Value Date   HGBA1C 5.8 (H) 03/17/2023   HGBA1C 6.3 (H) 10/07/2022   Lab Results  Component Value Date   INSULIN 38.6 (H) 03/17/2023   INSULIN 26.3 (H) 10/07/2022   Lab Results  Component Value Date   TSH 2.910 10/07/2022   CBC    Component Value Date/Time   WBC 7.5 10/07/2022 0930   RBC 5.07 10/07/2022 0930   HGB 11.0 (L) 10/07/2022 0930   HCT 37.5  03/17/2023 0849   PLT 414 10/07/2022 0930   MCV 69 (L) 10/07/2022 0930   MCH 21.7 (L) 10/07/2022 0930   MCHC 31.3 (L) 10/07/2022 0930   RDW 16.1 (H) 10/07/2022 0930   Iron Studies    Component Value Date/Time   IRON 20 (L) 03/17/2023 0849   TIBC 335 03/17/2023 0849   FERRITIN 22 03/17/2023 0849   IRONPCTSAT 6 (LL) 03/17/2023 0849   Lipid Panel     Component Value Date/Time   CHOL 110 10/07/2022 0930   TRIG 68 10/07/2022 0930   HDL 40 10/07/2022 0930   LDLCALC 56 10/07/2022 0930   Hepatic Function Panel     Component Value Date/Time   PROT 6.7 03/17/2023 0849   ALBUMIN 4.4 03/17/2023 0849   AST 10 03/17/2023 0849   ALT 15 03/17/2023 0849   ALKPHOS 105 03/17/2023 0849   BILITOT 0.2 03/17/2023 0849      Component Value Date/Time   TSH 2.910 10/07/2022 0930   Nutritional Lab Results  Component Value Date   VD25OH 35.0 03/17/2023   VD25OH 40.3 10/07/2022     ASSESSMENT AND PLAN  TREATMENT PLAN FOR OBESITY:  Recommended Dietary Goals  Jodi Gilbert is currently in the action stage of change. As such, her goal is to continue weight management plan. She has agreed to track and will review macros at next visit.  Use mynetdiary app.  Add a protein shake for breakfast.  Needs to increase protein intake and decrease carbs  Behavioral Intervention  We discussed the following Behavioral Modification Strategies today: increasing lean protein intake to established goals, decreasing simple carbohydrates , increasing vegetables, increasing fiber rich foods, work on tracking and journaling calories using tracking application, and continue to work on maintaining a reduced calorie state, getting the recommended amount of protein, incorporating whole foods, making healthy choices, staying well hydrated and practicing mindfulness when eating..  Additional resources provided today: Handouts given on protein snacks, protein content of food  Recommended Physical Activity Goals  Jodi Gilbert  has been advised to work up to 150 minutes of moderate intensity aerobic activity a week and strengthening exercises 2-3 times per week for cardiovascular health, weight loss maintenance and preservation of muscle mass.   She has agreed to Think about enjoyable ways to increase daily physical activity and overcoming barriers to exercise, Increase physical activity in their day and reduce sedentary time (increase NEAT)., and Work on scheduling and tracking physical activity.    Pharmacotherapy We discussed various medication options to help Jodi Gilbert with her weight loss efforts and we both agreed to continue [name] 8mg , increase to BID.  Side effects disussed.   ASSOCIATED CONDITIONS ADDRESSED TODAY  Action/Plan  Iron deficiency anemia, unspecified iron deficiency anemia type Take iron  daily.  Will recheck labs at next visit. Consider referral to hematology based upon lab results.    Prediabetes -     Start metFORMIN HCl; Take 1 tablet (500 mg total) by mouth daily with breakfast.  Dispense: 30 tablet; Refill: 1. Side effects discussed.  Patient reports she is not sexually active.  Handouts given on prediabetes, insulin resistance and metformin  Insulin resistance -     Start metFORMIN HCl; Take 1 tablet (500 mg total) by mouth daily with breakfast.  Dispense: 30 tablet; Refill: 1  Class 2 severe obesity due to excess calories with serious comorbidity and body mass index (BMI) of 39.0 to 39.9 in adult (HCC) -     Continue [name]; Take 1 tablet at 12:00 noon  Dispense: 30 tablet; Refill: 1. Side effects discussed.   Patient is currently in college and will be back home in 6 weeks.  Will see her back in person for follow-up when she is home for break.  Labs reviewed in chart with patient from 03/17/2023.  Will check labs at next visit.   Spent 40+ minutes with the patient today and reviewing her chart before and after her visit.   Return in about 6 weeks (around 06/23/2023).Marland Kitchen She was  informed of the importance of frequent follow up visits to maximize her success with intensive lifestyle modifications for her multiple health conditions.   ATTESTASTION STATEMENTS:  Reviewed by clinician on day of visit: allergies, medications, problem list, medical history, surgical history, family history, social history, and previous encounter notes.     Theodis Sato. Jodi Sukhu FNP-C

## 2023-06-23 ENCOUNTER — Ambulatory Visit: Payer: PRIVATE HEALTH INSURANCE | Admitting: Nurse Practitioner

## 2023-07-18 ENCOUNTER — Encounter: Payer: Self-pay | Admitting: Nurse Practitioner

## 2023-07-18 ENCOUNTER — Ambulatory Visit (INDEPENDENT_AMBULATORY_CARE_PROVIDER_SITE_OTHER): Payer: PRIVATE HEALTH INSURANCE | Admitting: Nurse Practitioner

## 2023-07-18 VITALS — BP 116/77 | HR 86 | Temp 98.1°F | Ht 65.0 in | Wt 231.0 lb

## 2023-07-18 DIAGNOSIS — E66812 Obesity, class 2: Secondary | ICD-10-CM

## 2023-07-18 DIAGNOSIS — Z79899 Other long term (current) drug therapy: Secondary | ICD-10-CM

## 2023-07-18 DIAGNOSIS — E88819 Insulin resistance, unspecified: Secondary | ICD-10-CM

## 2023-07-18 DIAGNOSIS — R7303 Prediabetes: Secondary | ICD-10-CM | POA: Diagnosis not present

## 2023-07-18 DIAGNOSIS — D509 Iron deficiency anemia, unspecified: Secondary | ICD-10-CM

## 2023-07-18 DIAGNOSIS — Z6838 Body mass index (BMI) 38.0-38.9, adult: Secondary | ICD-10-CM | POA: Diagnosis not present

## 2023-07-18 DIAGNOSIS — E6609 Other obesity due to excess calories: Secondary | ICD-10-CM

## 2023-07-18 MED ORDER — LOMAIRA 8 MG PO TABS: ORAL_TABLET | ORAL | 0 refills | Status: DC

## 2023-07-18 MED ORDER — METFORMIN HCL 500 MG PO TABS: 500.0000 mg | ORAL_TABLET | Freq: Every day | ORAL | 0 refills | Status: DC

## 2023-07-18 NOTE — Progress Notes (Signed)
 Office: [phone]  /  Fax: [phone]  WEIGHT SUMMARY AND BIOMETRICS  Weight Lost Since Last Visit: 8lb  Weight Gained Since Last Visit: 0lb   Vitals Temp: 98.1 F (36.7 C) BP: 116/77 Pulse Rate: 86 SpO2: 98 %   Anthropometric Measurements Height: 5\' 5"  (1.651 m) Weight: 231 lb (104.8 kg) BMI (Calculated): 38.44 Weight at Last Visit: 239lb Weight Lost Since Last Visit: 8lb Weight Gained Since Last Visit: 0lb Starting Weight: 246lb Total Weight Loss (lbs): 15 lb (6.804 kg)   Body Composition  Body Fat %: 43.2 % Fat Mass (lbs): 100 lbs Muscle Mass (lbs): 124.8 lbs Total Body Water (lbs): 86.6 lbs Visceral Fat Rating : 9   Other Clinical Data Fasting: Yes Labs: No Today's Visit #: 9 Starting Date: 10/07/22     HPI  Chief Complaint: OBESITY  Ashe is here to discuss her progress with her obesity treatment plan. She is on the practicing portion control and making smarter food choices, such as increasing vegetables and decreasing simple carbohydrates and states she is following her eating plan approximately 50 % of the time. She states she is exercising 60 minutes 3 days per week.   Interval History:  Since last office visit she has lost 8 pounds.  She is eating 2 meals and 2 snacks per day. 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  She is going to the beach this week.  She is drinking water and unsweetened tea.    Pharmacotherapy for weight loss: She is currently taking [name]  8mg  at noon for medical weight loss.  Denies side effects.  Denies chest pain, SHOB or palpitations.  Has helped with polyphagia and cravings.   Unable to take GLP-1 due to cost   Previous pharmacotherapy for medical weight loss:  None    Bariatric surgery:  Patient has not had bariatric surgery.     Prediabetes/IR Last A1c was 5.8  Medication(s): Metformin  500mg .  Started after last visit, stopped 1 month ago-didn't realize she  had a refill.  Denies side effects.  Felt it helped with polyphagia and would like to restart taking it. Polyphagia:Yes Lab Results  Component Value Date   HGBA1C 5.8 (H) 03/17/2023   HGBA1C 6.3 (H) 10/07/2022   Lab Results  Component Value Date   INSULIN  38.6 (H) 03/17/2023   INSULIN  26.3 (H) 10/07/2022   Iron  def anemia Taking iron  3 days per week.  Denies side effects.  Reports fatigue.  Menses every other month.  Doesn't feel her menses are "heavy".  Hasn't seen a GYN or had a pap smear in the past.   PHYSICAL EXAM:  Blood pressure 116/77, pulse 86, temperature 98.1 F (36.7 C), height 5\' 5"  (1.651 m), weight 231 lb (104.8 kg), SpO2 98%. Body mass index is 38.44 kg/m.  General: She is overweight, cooperative, alert, well developed, and in no acute distress. PSYCH: Has normal mood, affect and thought process.   Extremities: No edema.  Neurologic: No gross sensory or motor deficits. No tremors or fasciculations noted.    DIAGNOSTIC DATA REVIEWED:  BMET    Component Value Date/Time   NA 139 03/17/2023 0849   K 4.7 03/17/2023 0849   CL 103 03/17/2023 0849   CO2 21 03/17/2023 0849   GLUCOSE 97 03/17/2023 0849   BUN 12 03/17/2023 0849   CREATININE 0.62 03/17/2023 0849   CALCIUM 9.2 03/17/2023 0849   Lab Results  Component Value Date  HGBA1C 5.8 (H) 03/17/2023   HGBA1C 6.3 (H) 10/07/2022   Lab Results  Component Value Date   INSULIN  38.6 (H) 03/17/2023   INSULIN  26.3 (H) 10/07/2022   Lab Results  Component Value Date   TSH 2.910 10/07/2022   CBC    Component Value Date/Time   WBC 7.5 10/07/2022 0930   RBC 5.07 10/07/2022 0930   HGB 11.0 (L) 10/07/2022 0930   HCT 37.5 03/17/2023 0849   PLT 414 10/07/2022 0930   MCV 69 (L) 10/07/2022 0930   MCH 21.7 (L) 10/07/2022 0930   MCHC 31.3 (L) 10/07/2022 0930   RDW 16.1 (H) 10/07/2022 0930   Iron  Studies    Component Value Date/Time   IRON  20 (L) 03/17/2023 0849   TIBC 335 03/17/2023 0849   FERRITIN 22  03/17/2023 0849   IRONPCTSAT 6 (LL) 03/17/2023 0849   Lipid Panel     Component Value Date/Time   CHOL 110 10/07/2022 0930   TRIG 68 10/07/2022 0930   HDL 40 10/07/2022 0930   LDLCALC 56 10/07/2022 0930   Hepatic Function Panel     Component Value Date/Time   PROT 6.7 03/17/2023 0849   ALBUMIN 4.4 03/17/2023 0849   AST 10 03/17/2023 0849   ALT 15 03/17/2023 0849   ALKPHOS 105 03/17/2023 0849   BILITOT 0.2 03/17/2023 0849      Component Value Date/Time   TSH 2.910 10/07/2022 0930   Nutritional Lab Results  Component Value Date   VD25OH 35.0 03/17/2023   VD25OH 40.3 10/07/2022     ASSESSMENT AND PLAN  TREATMENT PLAN FOR OBESITY:  Recommended Dietary Goals  Belladonna is currently in the action stage of change. As such, her goal is to continue weight management plan. She has agreed to practicing portion control and making smarter food choices, such as increasing vegetables and decreasing simple carbohydrates.  Behavioral Intervention  We discussed the following Behavioral Modification Strategies today: increasing lean protein intake to established goals, increasing vegetables, increasing fiber rich foods, increasing water intake , and continue to work on maintaining a reduced calorie state, getting the recommended amount of protein, incorporating whole foods, making healthy choices, staying well hydrated and practicing mindfulness when eating..  Additional resources provided today: NA  Recommended Physical Activity Goals  Allona has been advised to work up to 150 minutes of moderate intensity aerobic activity a week and strengthening exercises 2-3 times per week for cardiovascular health, weight loss maintenance and preservation of muscle mass.   She has agreed to Think about enjoyable ways to increase daily physical activity and overcoming barriers to exercise, Increase physical activity in their day and reduce sedentary time (increase NEAT)., and continue to gradually  increase the amount and intensity of exercise routine   Pharmacotherapy We discussed various medication options to help Jakiyah with her weight loss efforts and we both agreed to continue [name]  8mg .  Denies side effects.  ASSOCIATED CONDITIONS ADDRESSED TODAY  Action/Plan  Prediabetes -     metFORMIN  HCl; Take 1 tablet (500 mg total) by mouth daily with breakfast.  Dispense: 30 tablet; Refill: 0 -     Hemoglobin A1c -     Insulin , random  Insulin  resistance -     metFORMIN  HCl; Take 1 tablet (500 mg total) by mouth daily with breakfast.  Dispense: 30 tablet; Refill: 0 -     Hemoglobin A1c -     Insulin , random  Iron  deficiency anemia, unspecified iron  deficiency anemia type -  Iron  and TIBC -     Ferritin -     CBC with Differential/Platelet   Will refer based upon lab results.   Medication management -     Comprehensive metabolic panel with GFR  Class 2 obesity due to excess calories without serious comorbidity with body mass index (BMI) of 38.0 to 38.9 in adult -     [name] ; Take 1 tablet at 12:00 noon  Dispense: 30 tablet; Refill: 0         Return in about 4 weeks (around 08/15/2023).Aaron Aas She was informed of the importance of frequent follow up visits to maximize her success with intensive lifestyle modifications for her multiple health conditions.   ATTESTASTION STATEMENTS:  Reviewed by clinician on day of visit: allergies, medications, problem list, medical history, surgical history, family history, social history, and previous encounter notes.     Crist Dominion. Keshan Reha FNP-C

## 2023-07-19 ENCOUNTER — Ambulatory Visit: Payer: Self-pay

## 2023-07-19 ENCOUNTER — Other Ambulatory Visit: Payer: Self-pay | Admitting: Nurse Practitioner

## 2023-07-19 DIAGNOSIS — D509 Iron deficiency anemia, unspecified: Secondary | ICD-10-CM

## 2023-07-19 LAB — CBC WITH DIFFERENTIAL/PLATELET
Basophils Absolute: 0.2 10*3/uL (ref 0.0–0.2)
Basos: 2 %
EOS (ABSOLUTE): 0 10*3/uL (ref 0.0–0.4)
Eos: 1 %
Hematocrit: 42.5 % (ref 34.0–46.6)
Hemoglobin: 12.9 g/dL (ref 11.1–15.9)
Immature Grans (Abs): 0 10*3/uL (ref 0.0–0.1)
Immature Granulocytes: 0 %
Lymphocytes Absolute: 6 10*3/uL — ABNORMAL HIGH (ref 0.7–3.1)
Lymphs: 68 %
MCH: 23 pg — ABNORMAL LOW (ref 26.6–33.0)
MCHC: 30.4 g/dL — ABNORMAL LOW (ref 31.5–35.7)
MCV: 76 fL — ABNORMAL LOW (ref 79–97)
Monocytes Absolute: 0.5 10*3/uL (ref 0.1–0.9)
Monocytes: 6 %
Neutrophils Absolute: 2 10*3/uL (ref 1.4–7.0)
Neutrophils: 23 %
Platelets: 192 10*3/uL (ref 150–450)
RBC: 5.62 x10E6/uL — ABNORMAL HIGH (ref 3.77–5.28)
RDW: 19.2 % — ABNORMAL HIGH (ref 11.7–15.4)
WBC: 8.7 10*3/uL (ref 3.4–10.8)

## 2023-07-19 LAB — COMPREHENSIVE METABOLIC PANEL WITH GFR
ALT: 261 IU/L — ABNORMAL HIGH (ref 0–32)
AST: 183 IU/L — ABNORMAL HIGH (ref 0–40)
Albumin: 4.5 g/dL (ref 4.0–5.0)
Alkaline Phosphatase: 276 IU/L — ABNORMAL HIGH (ref 42–106)
BUN/Creatinine Ratio: 13 (ref 9–23)
BUN: 8 mg/dL (ref 6–20)
Bilirubin Total: 0.5 mg/dL (ref 0.0–1.2)
CO2: 19 mmol/L — ABNORMAL LOW (ref 20–29)
Calcium: 9.4 mg/dL (ref 8.7–10.2)
Chloride: 101 mmol/L (ref 96–106)
Creatinine, Ser: 0.64 mg/dL (ref 0.57–1.00)
Globulin, Total: 2.7 g/dL (ref 1.5–4.5)
Glucose: 90 mg/dL (ref 70–99)
Potassium: 4.5 mmol/L (ref 3.5–5.2)
Sodium: 139 mmol/L (ref 134–144)
Total Protein: 7.2 g/dL (ref 6.0–8.5)
eGFR: 130 mL/min/{1.73_m2} (ref >59)

## 2023-07-19 LAB — HEMOGLOBIN A1C
Est. average glucose Bld gHb Est-mCnc: 105 mg/dL
Hgb A1c MFr Bld: 5.3 % (ref 4.8–5.6)

## 2023-07-19 LAB — FERRITIN: Ferritin: 88 ng/mL (ref 15–150)

## 2023-07-19 LAB — IRON AND TIBC
Iron Saturation: 9 % — CL (ref 15–55)
Iron: 32 ug/dL (ref 27–159)
Total Iron Binding Capacity: 375 ug/dL (ref 250–450)
UIBC: 343 ug/dL (ref 131–425)

## 2023-07-19 LAB — INSULIN, RANDOM: INSULIN: 16.9 u[IU]/mL (ref 2.6–24.9)

## 2023-07-19 NOTE — Progress Notes (Signed)
 Spoke with patient, she has been taking Ibuprofen and drinking alcohol.

## 2023-07-20 NOTE — Progress Notes (Signed)
 Message sent to patient

## 2023-07-25 ENCOUNTER — Encounter: Payer: Self-pay | Admitting: Hematology & Oncology

## 2023-08-03 ENCOUNTER — Inpatient Hospital Stay: Payer: PRIVATE HEALTH INSURANCE | Attending: Medical Oncology | Admitting: Medical Oncology

## 2023-08-03 ENCOUNTER — Encounter: Payer: Self-pay | Admitting: Medical Oncology

## 2023-08-03 ENCOUNTER — Inpatient Hospital Stay: Payer: PRIVATE HEALTH INSURANCE

## 2023-08-03 VITALS — BP 125/71 | HR 68 | Temp 98.2°F | Resp 18 | Ht 65.0 in | Wt 227.0 lb

## 2023-08-03 DIAGNOSIS — D508 Other iron deficiency anemias: Secondary | ICD-10-CM | POA: Insufficient documentation

## 2023-08-03 DIAGNOSIS — R233 Spontaneous ecchymoses: Secondary | ICD-10-CM | POA: Insufficient documentation

## 2023-08-03 DIAGNOSIS — R5383 Other fatigue: Secondary | ICD-10-CM | POA: Diagnosis not present

## 2023-08-03 DIAGNOSIS — N920 Excessive and frequent menstruation with regular cycle: Secondary | ICD-10-CM | POA: Insufficient documentation

## 2023-08-03 DIAGNOSIS — Z79899 Other long term (current) drug therapy: Secondary | ICD-10-CM | POA: Insufficient documentation

## 2023-08-03 DIAGNOSIS — Z8249 Family history of ischemic heart disease and other diseases of the circulatory system: Secondary | ICD-10-CM | POA: Insufficient documentation

## 2023-08-03 MED ORDER — FUSION PLUS PO CAPS: 1.0000 | ORAL_CAPSULE | Freq: Every morning | ORAL | 6 refills | Status: AC

## 2023-08-03 NOTE — Progress Notes (Signed)
 [facility] [phone]   [phone]  INITIAL CONSULT NOTE  Patient Care Team: Bonita Bussing, MD as PCP - General (Pediatrics)  CHIEF COMPLAINTS/PURPOSE OF CONSULTATION:  Anemia   HISTORY OF PRESENTING ILLNESS:  Jodi Gilbert 23 y.o. female is referred to our office by their PCP Dr. Bonita Bussing for anemia  She reports that she has had anemia since around 2022 when she tired to donate blood and her counts were too low.   Blood in stools:No Change of bowel movement/constipation: No Family history of GI cancers: No Last Colonoscopy/Endoscopy History of GERD or GI bleed: No UC/Crohn's: No PPI use: No Hiatal Hernia: No NSAID use: Yes- she takes this about 3 times per week Blood in urine: No Difficulty swallowing: No Pica: No SOB: No Fatigue: Yes- mild Prior blood transfusion: No Prior history of blood loss: No Liver disease: No Kidney Disease: No Bariatric/Intestinal surgery: No Frequent Blood Donation: No Vaginal bleeding: Occur every other month. Heavy on the first 2 days then lighter in nature.  Prior evaluation with hematology: No Prior bone marrow biopsy: No Oral iron : occasionally - tolerating well  Prior IV iron  infusions: No Family history Anemia: No known sickle cell or thalassemia  No Personal or family history of bleeding or clotting disorders.  Special diets: No No use of GLP-1: No Iron  rich foods in diet: No Eating habits: 2 meals per day   MEDICAL HISTORY:  Past Medical History:  Diagnosis Date   Anemia    Back pain     SURGICAL HISTORY: No past surgical history on file.  SOCIAL HISTORY: Social History   Socioeconomic History   Marital status: Single    Spouse name: Not on file   Number of children: Not on file   Years of education: Not on file   Highest education level: Not on file  Occupational History   Not on file  Tobacco Use   Smoking status: Never   Smokeless tobacco: Never   Vaping Use   Vaping status: Never Used  Substance and Sexual Activity   Alcohol use: Never   Drug use: Never   Sexual activity: Not on file  Other Topics Concern   Not on file  Social History Narrative   Not on file   Social Drivers of Health   Financial Resource Strain: Not on file  Food Insecurity: No Food Insecurity (08/03/2023)   Hunger Vital Sign    Worried About Running Out of Food in the Last Year: Never true    Ran Out of Food in the Last Year: Never true  Transportation Needs: No Transportation Needs (08/03/2023)   PRAPARE - Administrator, Civil Service (Medical): No    Lack of Transportation (Non-Medical): No  Physical Activity: Not on file  Stress: Not on file  Social Connections: Not on file  Intimate Partner Violence: Not At Risk (08/03/2023)   Humiliation, Afraid, Rape, and Kick questionnaire    Fear of Current or Ex-Partner: No    Emotionally Abused: No    Physically Abused: No    Sexually Abused: No    FAMILY HISTORY: Family History  Problem Relation Age of Onset   High blood pressure Mother     ALLERGIES:  has no known allergies.  MEDICATIONS:  Current Outpatient Medications  Medication Sig Dispense Refill   iron  polysaccharides (NU-IRON ) 150 MG capsule Take 1 capsule (150 mg total) by mouth daily. 30 capsule 1   Iron -FA-B Cmp-C-Biot-Probiotic (FUSION PLUS) CAPS Take  1 capsule by mouth in the morning. 90 capsule 6   metFORMIN  (GLUCOPHAGE ) 500 MG tablet Take 1 tablet (500 mg total) by mouth daily with breakfast. 30 tablet 0   Phentermine  HCl ([name] ) 8 MG TABS Take 1 tablet at 12:00 noon 30 tablet 0   No current facility-administered medications for this visit.    REVIEW OF SYSTEMS:   Constitutional: ( - ) fevers, ( - )  chills , ( - ) night sweats Eyes: ( - ) blurriness of vision, ( - ) double vision, ( - ) watery eyes Ears, nose, mouth, throat, and face: ( - ) mucositis, ( - ) sore throat Respiratory: ( - ) cough, ( - ) dyspnea, (  - ) wheezes Cardiovascular: ( - ) palpitation, ( - ) chest discomfort, ( - ) lower extremity swelling Gastrointestinal:  ( - ) nausea, ( - ) heartburn, ( - ) change in bowel habits Skin: ( - ) abnormal skin rashes Lymphatics: ( - ) new lymphadenopathy, ( + ) easy bruising Neurological: ( - ) numbness, ( - ) tingling, ( - ) new weaknesses Behavioral/Psych: ( - ) mood change, ( - ) new changes  All other systems were reviewed with the patient and are negative.  PHYSICAL EXAMINATION: ECOG PERFORMANCE STATUS: 1 - Symptomatic but completely ambulatory  Vitals:   08/03/23 1306  BP: 125/71  Pulse: 68  Resp: 18  Temp: 98.2 F (36.8 C)  SpO2: 95%   Filed Weights   08/03/23 1306  Weight: 227 lb 0.6 oz (103 kg)    GENERAL: well appearing female in NAD  SKIN: skin color, texture, turgor are normal, no rashes or significant lesions EYES: conjunctiva are pink and non-injected, sclera clear OROPHARYNX: no exudate, no erythema; lips, buccal mucosa, and tongue normal  NECK: supple, non-tender LYMPH:  no palpable lymphadenopathy in the cervical, axillary or supraclavicular lymph nodes.  LUNGS: clear to auscultation and percussion with normal breathing effort HEART: regular rate & rhythm and no murmurs and no lower extremity edema ABDOMEN: soft, non-tender, non-distended, normal bowel sounds Musculoskeletal: no cyanosis of digits and no clubbing  PSYCH: alert & oriented x 3, fluent speech NEURO: no focal motor/sensory deficits  LABORATORY DATA:  Pending   Encounter Diagnosis  Name Primary?   Iron  deficiency anemia secondary to inadequate dietary iron  intake Yes    ASSESSMENT & PLAN Jodi Gilbert is a 23 y.o. caucasian female who was referred to us  for iron  deficiency.   Recent labs from 07/18/2023 showed a Hgb of 12.9, MCV of 76, ferritin of 88, iron  saturation of 9%. Prior to this iron  saturation was 6%  At this time she has very clear IDA secondary to multiple causes (heavy  menses, poor dietary intake). She has no red flags in concerning for GI origin. She elects to trial oral iron  for another 2-3 months to see if taking this on a consistent basis helps her levels. If not we discussed IV iron . We also reviewed oral iron  common side effects. We discussed IV infusions including how they are administered, length of infusion time, common side effects and the low risk of infusion reactions. She is aware that often when we replenish stores in individuals who are very deficient they typically will need a few series of infusions before labs are stable.    No labs today RTC 3 month APP, labs (CBC, iron , ferritin, retic)  All questions were answered. The patient knows to call the clinic with any problems, questions or  concerns.  I have spent a total of 30 minutes minutes of face-to-face and non-face-to-face time, preparing to see the patient, obtaining and/or reviewing separately obtained history, performing a medically appropriate examination, counseling and educating the patient, ordering medications/tests/procedures, referring and communicating with other health care professionals, documenting clinical information in the electronic health record, independently interpreting results and communicating results to the patient, and care coordination.    Sunnie England PA-C Department of Hematology/Oncology [facility] at [facility]

## 2023-08-18 ENCOUNTER — Ambulatory Visit: Payer: PRIVATE HEALTH INSURANCE | Admitting: Nurse Practitioner

## 2023-08-30 ENCOUNTER — Ambulatory Visit: Payer: PRIVATE HEALTH INSURANCE | Admitting: Nurse Practitioner

## 2023-09-06 ENCOUNTER — Ambulatory Visit: Payer: PRIVATE HEALTH INSURANCE | Admitting: Nurse Practitioner

## 2023-09-06 ENCOUNTER — Encounter: Payer: Self-pay | Admitting: Nurse Practitioner

## 2023-09-06 VITALS — BP 108/74 | HR 67 | Temp 98.0°F | Ht 65.0 in | Wt 225.0 lb

## 2023-09-06 DIAGNOSIS — E88819 Insulin resistance, unspecified: Secondary | ICD-10-CM

## 2023-09-06 DIAGNOSIS — D509 Iron deficiency anemia, unspecified: Secondary | ICD-10-CM

## 2023-09-06 DIAGNOSIS — E66812 Obesity, class 2: Secondary | ICD-10-CM

## 2023-09-06 DIAGNOSIS — Z6837 Body mass index (BMI) 37.0-37.9, adult: Secondary | ICD-10-CM

## 2023-09-06 DIAGNOSIS — R7303 Prediabetes: Secondary | ICD-10-CM

## 2023-09-06 DIAGNOSIS — R748 Abnormal levels of other serum enzymes: Secondary | ICD-10-CM

## 2023-09-06 MED ORDER — LOMAIRA 8 MG PO TABS: ORAL_TABLET | ORAL | 0 refills | Status: DC

## 2023-09-06 MED ORDER — METFORMIN HCL 500 MG PO TABS: 500.0000 mg | ORAL_TABLET | Freq: Every day | ORAL | 0 refills | Status: DC

## 2023-09-06 NOTE — Progress Notes (Addendum)
 Office: [phone]  /  Fax: [phone]  WEIGHT SUMMARY AND BIOMETRICS  Weight Lost Since Last Visit: 6lb  Weight Gained Since Last Visit: 0lb   Vitals Temp: 98 F (36.7 C) BP: 108/74 Pulse Rate: 67 SpO2: 99 %   Anthropometric Measurements Height: 5' 5 (1.651 m) Weight: 225 lb (102.1 kg) BMI (Calculated): 37.44 Weight at Last Visit: 231lb Weight Lost Since Last Visit: 6lb Weight Gained Since Last Visit: 0lb Starting Weight: 246lb Total Weight Loss (lbs): 21 lb (9.526 kg)   Body Composition  Body Fat %: 43.1 % Fat Mass (lbs): 97.2 lbs Muscle Mass (lbs): 121.8 lbs Total Body Water (lbs): 86.4 lbs Visceral Fat Rating : 8   Other Clinical Data Fasting: Yes Labs: No Today's Visit #: 10 Starting Date: 10/07/22     HPI  Chief Complaint: OBESITY  Jodi Gilbert is here to discuss her progress with her obesity treatment plan. She is on the practicing portion control and making smarter food choices, such as increasing vegetables and decreasing simple carbohydrates and states she is following her eating plan approximately 50 % of the time. She states she is exercising 0 minutes 0 days per week.   Interval History:  Since last office visit she has lost 6 pounds.  She went to the beach since her last visit.  She is watching her portion sizes.  She is eating 2 meals and 2 snacks.  She snacks on yogurt bites.  She is drinking water daily. Denies sugary drinks.    Pharmacotherapy for weight loss: She is currently taking [name]  8mg  at noon for medical weight loss.  Denies side effects.  Denies chest pain, SHOB or palpitations.  Has overall helped with polyphagia and cravings.    Unable to take GLP-1 due to cost   Previous pharmacotherapy for medical weight loss:  None   Bariatric surgery:  Patient has not had bariatric surgery.    Prediabetes/insulin  resistance   Last A1c was 5.3  Medication(s): Metformin  500mg . She averages taking 3 days per week.  Denies side  effects.   Polyphagia:Yes Lab Results  Component Value Date   HGBA1C 5.3 07/18/2023   HGBA1C 5.8 (H) 03/17/2023   HGBA1C 6.3 (H) 10/07/2022   Lab Results  Component Value Date   INSULIN  16.9 07/18/2023   INSULIN  38.6 (H) 03/17/2023   INSULIN  26.3 (H) 10/07/2022   Iron  def anemia She is taking OTC iron  4 days per week-not taking daily.  Saw hematology last 08/03/23 and has a follow up appt scheduled on 10/06/23  Elevated liver enzymes She is taking ibuprofen OTC.  Last alcohol intake was 08/29/23.  Denies abd pain.    PHYSICAL EXAM:  Blood pressure 108/74, pulse 67, temperature 98 F (36.7 C), height 5' 5 (1.651 m), weight 225 lb (102.1 kg), last menstrual period 08/28/2023, SpO2 99%. Body mass index is 37.44 kg/m.  General: She is overweight, cooperative, alert, well developed, and in no acute distress. PSYCH: Has normal mood, affect and thought process.   Extremities: No edema.  Neurologic: No gross sensory or motor deficits. No tremors or fasciculations noted.    DIAGNOSTIC DATA REVIEWED:  BMET    Component Value Date/Time   NA 139 07/18/2023 0956   K 4.5 07/18/2023 0956   CL 101 07/18/2023 0956   CO2 19 (L) 07/18/2023 0956   GLUCOSE 90 07/18/2023 0956   BUN 8 07/18/2023 0956   CREATININE 0.64 07/18/2023 0956   CALCIUM 9.4 07/18/2023 0956   Lab Results  Component  Value Date   HGBA1C 5.3 07/18/2023   HGBA1C 6.3 (H) 10/07/2022   Lab Results  Component Value Date   INSULIN  16.9 07/18/2023   INSULIN  26.3 (H) 10/07/2022   Lab Results  Component Value Date   TSH 2.910 10/07/2022   CBC    Component Value Date/Time   WBC 8.7 07/18/2023 0956   RBC 5.62 (H) 07/18/2023 0956   HGB 12.9 07/18/2023 0956   HCT 42.5 07/18/2023 0956   PLT 192 07/18/2023 0956   MCV 76 (L) 07/18/2023 0956   MCH 23.0 (L) 07/18/2023 0956   MCHC 30.4 (L) 07/18/2023 0956   RDW 19.2 (H) 07/18/2023 0956   Iron  Studies    Component Value Date/Time   IRON  32 07/18/2023 0956   TIBC  375 07/18/2023 0956   FERRITIN 88 07/18/2023 0956   IRONPCTSAT 9 (LL) 07/18/2023 0956   Lipid Panel     Component Value Date/Time   CHOL 110 10/07/2022 0930   TRIG 68 10/07/2022 0930   HDL 40 10/07/2022 0930   LDLCALC 56 10/07/2022 0930   Hepatic Function Panel     Component Value Date/Time   PROT 7.2 07/18/2023 0956   ALBUMIN 4.5 07/18/2023 0956   AST 183 (H) 07/18/2023 0956   ALT 261 (H) 07/18/2023 0956   ALKPHOS 276 (H) 07/18/2023 0956   BILITOT 0.5 07/18/2023 0956      Component Value Date/Time   TSH 2.910 10/07/2022 0930   Nutritional Lab Results  Component Value Date   VD25OH 35.0 03/17/2023   VD25OH 40.3 10/07/2022     ASSESSMENT AND PLAN  TREATMENT PLAN FOR OBESITY:  Recommended Dietary Goals  Jodi Gilbert is currently in the action stage of change. As such, her goal is to continue weight management plan. She has agreed to practicing portion control and making smarter food choices, such as increasing vegetables and decreasing simple carbohydrates.  Behavioral Intervention  We discussed the following Behavioral Modification Strategies today: increasing lean protein intake to established goals, decreasing simple carbohydrates , increasing vegetables, increasing fiber rich foods, avoiding skipping meals, increasing water intake , reading food labels , keeping healthy foods at home, and continue to work on maintaining a reduced calorie state, getting the recommended amount of protein, incorporating whole foods, making healthy choices, staying well hydrated and practicing mindfulness when eating..  Additional resources provided today: NA  Recommended Physical Activity Goals  Jodi Gilbert has been advised to work up to 150 minutes of moderate intensity aerobic activity a week and strengthening exercises 2-3 times per week for cardiovascular health, weight loss maintenance and preservation of muscle mass.   She has agreed to Think about enjoyable ways to increase daily  physical activity and overcoming barriers to exercise, Increase physical activity in their day and reduce sedentary time (increase NEAT)., and Work on scheduling and tracking physical activity.    Pharmacotherapy We discussed various medication options to help Jodi Gilbert with her weight loss efforts and we both agreed to continue [name]  8mg .  Side effects discussed.  ASSOCIATED CONDITIONS ADDRESSED TODAY  Action/Plan  Prediabetes -     Continue metFORMIN  HCl; Take 1 tablet (500 mg total) by mouth daily with breakfast.  Dispense: 30 tablet; Refill: 0.  Side effects discussed.  Discussed extensively with her today to take medications as directed.  Iron  deficiency anemia, unspecified iron  deficiency anemia type Continue follow-up with hematology and keep follow-up appointment.  Discussed the importance of taking medications on a daily basis as directed.  Elevated liver enzymes -  Comprehensive metabolic panel with GFR  If liver enzymes still elevated will refer to GI.  Stop alcohol intake  Insulin  resistance -     metFORMIN  HCl; Take 1 tablet (500 mg total) by mouth daily with breakfast.  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   ATTESTASTION STATEMENTS:  Reviewed by clinician on day of visit: allergies, medications, problem list, medical history, surgical history, family history, social history, and previous encounter notes.     Jodi Gilbert. Jodi Wernli FNP-C

## 2023-09-07 ENCOUNTER — Ambulatory Visit: Payer: Self-pay | Admitting: Nurse Practitioner

## 2023-09-07 LAB — COMPREHENSIVE METABOLIC PANEL WITH GFR
ALT: 15 IU/L (ref 0–32)
AST: 13 IU/L (ref 0–40)
Albumin: 4.4 g/dL (ref 4.0–5.0)
Alkaline Phosphatase: 91 IU/L (ref 42–106)
BUN/Creatinine Ratio: 15 (ref 9–23)
BUN: 11 mg/dL (ref 6–20)
Bilirubin Total: 0.4 mg/dL (ref 0.0–1.2)
CO2: 19 mmol/L — ABNORMAL LOW (ref 20–29)
Calcium: 9.5 mg/dL (ref 8.7–10.2)
Chloride: 103 mmol/L (ref 96–106)
Creatinine, Ser: 0.75 mg/dL (ref 0.57–1.00)
Globulin, Total: 2.6 g/dL (ref 1.5–4.5)
Glucose: 89 mg/dL (ref 70–99)
Potassium: 5 mmol/L (ref 3.5–5.2)
Sodium: 140 mmol/L (ref 134–144)
Total Protein: 7 g/dL (ref 6.0–8.5)
eGFR: 117 mL/min/{1.73_m2} (ref >59)

## 2023-10-05 ENCOUNTER — Other Ambulatory Visit: Payer: Self-pay | Admitting: Medical Oncology

## 2023-10-05 DIAGNOSIS — D508 Other iron deficiency anemias: Secondary | ICD-10-CM

## 2023-10-06 ENCOUNTER — Inpatient Hospital Stay: Payer: PRIVATE HEALTH INSURANCE | Attending: Medical Oncology

## 2023-10-06 ENCOUNTER — Inpatient Hospital Stay (HOSPITAL_BASED_OUTPATIENT_CLINIC_OR_DEPARTMENT_OTHER): Payer: PRIVATE HEALTH INSURANCE | Admitting: Medical Oncology

## 2023-10-06 VITALS — BP 116/59 | HR 64 | Temp 97.7°F | Resp 18 | Ht 65.0 in | Wt 228.8 lb

## 2023-10-06 DIAGNOSIS — D508 Other iron deficiency anemias: Secondary | ICD-10-CM | POA: Insufficient documentation

## 2023-10-06 DIAGNOSIS — Z79899 Other long term (current) drug therapy: Secondary | ICD-10-CM | POA: Diagnosis not present

## 2023-10-06 DIAGNOSIS — N920 Excessive and frequent menstruation with regular cycle: Secondary | ICD-10-CM | POA: Insufficient documentation

## 2023-10-06 DIAGNOSIS — D500 Iron deficiency anemia secondary to blood loss (chronic): Secondary | ICD-10-CM | POA: Insufficient documentation

## 2023-10-06 LAB — RETICULOCYTES
Immature Retic Fract: 4 % (ref 2.3–15.9)
RBC.: 5.25 MIL/uL — ABNORMAL HIGH (ref 3.87–5.11)
Retic Count, Absolute: 67.2 K/uL (ref 19.0–186.0)
Retic Ct Pct: 1.3 % (ref 0.4–3.1)

## 2023-10-06 LAB — IRON AND IRON BINDING CAPACITY (CC-WL,HP ONLY)
Iron: 24 ug/dL — ABNORMAL LOW (ref 28–170)
Saturation Ratios: 6 % — ABNORMAL LOW (ref 10.4–31.8)
TIBC: 388 ug/dL (ref 250–450)
UIBC: 364 ug/dL

## 2023-10-06 LAB — CBC WITH DIFFERENTIAL (CANCER CENTER ONLY)
Abs Immature Granulocytes: 0.06 K/uL (ref 0.00–0.07)
Basophils Absolute: 0.1 K/uL (ref 0.0–0.1)
Basophils Relative: 1 %
Eosinophils Absolute: 0.1 K/uL (ref 0.0–0.5)
Eosinophils Relative: 1 %
HCT: 40.3 % (ref 36.0–46.0)
Hemoglobin: 12.6 g/dL (ref 12.0–15.0)
Immature Granulocytes: 1 %
Lymphocytes Relative: 28 %
Lymphs Abs: 2 K/uL (ref 0.7–4.0)
MCH: 23.9 pg — ABNORMAL LOW (ref 26.0–34.0)
MCHC: 31.3 g/dL (ref 30.0–36.0)
MCV: 76.3 fL — ABNORMAL LOW (ref 80.0–100.0)
Monocytes Absolute: 0.4 K/uL (ref 0.1–1.0)
Monocytes Relative: 6 %
Neutro Abs: 4.6 K/uL (ref 1.7–7.7)
Neutrophils Relative %: 63 %
Platelet Count: 341 K/uL (ref 150–400)
RBC: 5.28 MIL/uL — ABNORMAL HIGH (ref 3.87–5.11)
RDW: 15.6 % — ABNORMAL HIGH (ref 11.5–15.5)
WBC Count: 7.2 K/uL (ref 4.0–10.5)
nRBC: 0 % (ref 0.0–0.2)

## 2023-10-06 LAB — FERRITIN: Ferritin: 30 ng/mL (ref 11–307)

## 2023-10-06 NOTE — Progress Notes (Signed)
 Hematology and Oncology Follow Up Visit  Jodi Gilbert [id number] 2000/06/05 23 y.o. 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 She did not have any red flag signs or symptoms suggesting GI origin. She elected to trial oral iron . Labs from initial consult shown below:    Labs from 07/18/2023 showed a Hgb of 12.9, MCV of 76, ferritin of 88, iron  saturation of 9%. Prior to this iron  saturation was 6%   Today she states that she has been doing well. No changes to health since we last saw her.   She reports that she has been forgetful for taking her iron  and is only taking it about 1-2 times per week if that. She is tolerating it well when she does.   Symptoms of fatigue have been improved  There has been no bleeding to her knowledge: denies epistaxis, gingivitis, hemoptysis, hematemesis, hematuria, melena, excessive bruising, blood donation.   Menstrual cycles have been regular, lasting about 7 days and light to average in nature.    Wt Readings from Last 3 Encounters:  10/06/23 228 lb 12.8 oz (103.8 kg)  09/06/23 225 lb (102.1 kg)  08/03/23 227 lb 0.6 oz (103 kg)     Medications:   Current Outpatient Medications:    iron  polysaccharides (NU-IRON ) 150 MG capsule, Take 1 capsule (150 mg total) by mouth daily., Disp: 30 capsule, Rfl: 1   Iron -FA-B Cmp-C-Biot-Probiotic (FUSION PLUS) CAPS, Take 1 capsule by mouth in the morning., Disp: 90 capsule, Rfl: 6   metFORMIN  (GLUCOPHAGE ) 500 MG tablet, Take 1 tablet (500 mg total) by mouth daily with breakfast., Disp: 30 tablet, Rfl: 0   Phentermine  HCl ([name] ) 8 MG TABS, Take 1 tablet at  12:00 noon, Disp: 30 tablet, Rfl: 0  Allergies: No Known Allergies  Past Medical History, Surgical history, Social history, and Family History were reviewed and updated.  Review of Systems: Review of Systems  All other systems reviewed and are negative.    Physical Exam:  height is 5' 5 (1.651 m) and weight is 228 lb 12.8 oz (103.8 kg). Her oral temperature is 97.7 F (36.5 C). Her blood pressure is 116/59 (abnormal) and her pulse is 64. Her respiration is 18 and oxygen saturation is 99%.   Physical Exam General: NAD Cardiovascular: regular rate and rhythm Pulmonary: clear ant fields Abdomen: soft, nontender, + bowel sounds GU: no suprapubic tenderness Extremities: no edema, no joint deformities Skin: no rashes Neurological: Weakness but otherwise nonfocal   Lab Results  Component Value Date   WBC 7.2 10/06/2023   HGB 12.6 10/06/2023   HCT 40.3 10/06/2023   MCV 76.3 (L) 10/06/2023   PLT 341 10/06/2023     Chemistry      Component Value Date/Time   NA 140 09/06/2023 1319   K 5.0 09/06/2023 1319   CL 103 09/06/2023 1319   CO2 19 (L) 09/06/2023 1319   BUN 11 09/06/2023 1319   CREATININE 0.75 09/06/2023 1319      Component Value Date/Time   CALCIUM 9.5 09/06/2023 1319   ALKPHOS 91 09/06/2023 1319   AST 13 09/06/2023  1319   ALT 15 09/06/2023 1319   BILITOT 0.4 09/06/2023 1319     Encounter Diagnosis  Name Primary?   Iron  deficiency anemia secondary to inadequate dietary iron  intake Yes    Assessment and Plan- Patient is a 23 y.o. female with IDA secondary to menses and poor dietary intake of iron . She is currently taking oral iron  sporadically.   Today her CBC looks stable. Iron  studies pending We discussed way to help with compliance of oral iron .     Disposition: RTC 3 months APP, labs (CBC, retic, iron , ferritin)   [name] PA-C 7/31/202512:19 PM

## 2023-10-10 ENCOUNTER — Ambulatory Visit: Payer: PRIVATE HEALTH INSURANCE | Admitting: Bariatrics

## 2023-10-10 ENCOUNTER — Ambulatory Visit: Payer: Self-pay | Admitting: Medical Oncology

## 2023-10-11 ENCOUNTER — Ambulatory Visit: Payer: PRIVATE HEALTH INSURANCE | Admitting: Bariatrics

## 2023-10-11 ENCOUNTER — Encounter: Payer: Self-pay | Admitting: Bariatrics

## 2023-10-11 DIAGNOSIS — E669 Obesity, unspecified: Secondary | ICD-10-CM

## 2023-10-11 DIAGNOSIS — Z6837 Body mass index (BMI) 37.0-37.9, adult: Secondary | ICD-10-CM

## 2023-10-11 DIAGNOSIS — R7303 Prediabetes: Secondary | ICD-10-CM

## 2023-10-11 DIAGNOSIS — E66812 Obesity, class 2: Secondary | ICD-10-CM

## 2023-10-11 DIAGNOSIS — E88819 Insulin resistance, unspecified: Secondary | ICD-10-CM

## 2023-10-11 MED ORDER — METFORMIN HCL 500 MG PO TABS: 500.0000 mg | ORAL_TABLET | Freq: Every day | ORAL | 0 refills | Status: DC

## 2023-10-11 MED ORDER — LOMAIRA 8 MG PO TABS: ORAL_TABLET | ORAL | 0 refills | Status: DC

## 2023-10-11 NOTE — Progress Notes (Signed)
 WEIGHT SUMMARY AND BIOMETRICS  Weight Lost Since Last Visit: 0  Weight Gained Since Last Visit: 1lb   Vitals Temp: 97.7 F (36.5 C) BP: 111/74 Pulse Rate: 71 SpO2: 98 %   Anthropometric Measurements Height: 5' 5 (1.651 m) Weight: 226 lb (102.5 kg) BMI (Calculated): 37.61 Weight at Last Visit: 225lb Weight Lost Since Last Visit: 0 Weight Gained Since Last Visit: 1lb Starting Weight: 246lb Total Weight Loss (lbs): 20 lb (9.072 kg)   Body Composition  Body Fat %: 42.9 % Fat Mass (lbs): 97.2 lbs Muscle Mass (lbs): 123 lbs Total Body Water (lbs): 87.4 lbs Visceral Fat Rating : 8   Other Clinical Data Fasting: yes Labs: no Today's Visit #: 11 Starting Date: 10/07/22    OBESITY Kymberlee is here to discuss her progress with her obesity treatment plan along with follow-up of her obesity related diagnoses.    Nutrition Plan: practicing portion control and making smarter food choices, such as increasing vegetables and decreasing simple carbohydrates - 60% adherence.  Current exercise: walking  Interim History:  She is up 1 lb since her last visit.  Eating all of the food on the plan., Protein intake is as prescribed, Is skipping meals, Water intake is adequate., and Denies polyphagia   Pharmacotherapy: Selinda is on [name]  8 mg by mouth  At Noon daily  Adverse side effects: None Hunger is moderately controlled.  Cravings are moderately controlled.  Assessment/Plan:   Prediabetes Last A1c was 5.3, previously 5.8.  Medication(s): Metformin  500 mg 1 daily with breakfast the AM Lab Results  Component Value Date   HGBA1C 5.3 07/18/2023   HGBA1C 5.8 (H) 03/17/2023   HGBA1C 6.3 (H) 10/07/2022   Lab Results  Component Value Date   INSULIN  16.9 07/18/2023   INSULIN  38.6 (H) 03/17/2023   INSULIN  26.3 (H) 10/07/2022    Plan: Will minimize all  refined carbohydrates both sweets and starches.  Will work on the plan and exercise.  Will continue her exercise regimen at least 3 times a week. Will keep protein, water, and fiber intake high.  Will continue medications.  Continue and refill [name]  8 mg by mouth  at noon daily   Insulin  Resistance Jesica has had elevated fasting insulin  readings. Goal is HgbA1c < 5.7, fasting insulin  at l0 or less, and preferably at 5.  She denies polyphagia if she eats properly and takes her [name]  at lunch. Medication(s):lLomaira Lab Results  Component Value Date   HGBA1C 5.3 07/18/2023   Lab Results  Component Value Date   INSULIN  16.9 07/18/2023   INSULIN  38.6 (H) 03/17/2023   INSULIN  26.3 (H) 10/07/2022    Plan Medication(s): Wegovy  1.7 SQ weekly and [name]  8 mg by mouth  at lunch Will work on the agreed upon plan. Will minimize refined carbohydrates ( sweets and starches), and focus more on complex carbohydrates.  Increase the  micronutrients found in leafy greens, which include magnesium, polyphenols, and vitamin C which have been postulated to help with insulin  sensitivity. Minimize fast food and cook more meals at home.  She will not keep healthy snacks in her apartment at school. Increase fiber to 25 to 30 grams daily.    Generalized Obesity: Current BMI BMI (Calculated): 37.61   Pharmacotherapy Plan Continue and refill  [name]  8 mg by mouth  At noon daily   Ravin is currently in the action stage of change. As such, her goal is to continue with weight loss efforts.  She has agreed to practicing portion control and making smarter food choices, such as increasing vegetables and decreasing simple carbohydrates.  Exercise goals: For substantial health benefits, adults should do at least 150 minutes (2 hours and 30 minutes) a week of moderate-intensity, or 75 minutes (1 hour and 15 minutes) a week of vigorous-intensity aerobic physical activity, or an equivalent combination of  moderate- and vigorous-intensity aerobic activity. Aerobic activity should be performed in episodes of at least 10 minutes, and preferably, it should be spread throughout the week. She will use the treadmill.   Behavioral modification strategies: increasing lean protein intake, decreasing simple carbohydrates , no meal skipping, meal planning , increase water intake, better snacking choices, planning for success, decrease junk food, get rid of junk food in the home, avoiding temptations, keep healthy foods in the home, and weigh protein portions.  Aymee has agreed to follow-up with our clinic in 4 weeks.    Objective:   VITALS: Per patient if applicable, see vitals. GENERAL: Alert and in no acute distress. CARDIOPULMONARY: No increased WOB. Speaking in clear sentences.  PSYCH: Pleasant and cooperative. Speech normal rate and rhythm. Affect is appropriate. Insight and judgement are appropriate. Attention is focused, linear, and appropriate.  NEURO: Oriented as arrived to appointment on time with no prompting.   Attestation Statements:   This was prepared with the assistance of Engineer, civil (consulting).  Occasional wrong-word or sound-a-like substitutions may have occurred due to the inherent limitations of voice recognition   Clayborne Daring, DO

## 2023-11-08 ENCOUNTER — Ambulatory Visit: Payer: PRIVATE HEALTH INSURANCE | Admitting: Bariatrics

## 2023-12-15 ENCOUNTER — Ambulatory Visit: Payer: PRIVATE HEALTH INSURANCE | Admitting: Nurse Practitioner

## 2023-12-26 ENCOUNTER — Other Ambulatory Visit: Payer: Self-pay | Admitting: Bariatrics

## 2023-12-26 DIAGNOSIS — E88819 Insulin resistance, unspecified: Secondary | ICD-10-CM

## 2023-12-26 DIAGNOSIS — R7303 Prediabetes: Secondary | ICD-10-CM

## 2024-01-04 ENCOUNTER — Inpatient Hospital Stay: Payer: PRIVATE HEALTH INSURANCE | Admitting: Medical Oncology

## 2024-01-04 ENCOUNTER — Inpatient Hospital Stay: Payer: PRIVATE HEALTH INSURANCE

## 2024-02-20 ENCOUNTER — Encounter: Payer: Self-pay | Admitting: Nurse Practitioner

## 2024-02-20 ENCOUNTER — Ambulatory Visit: Payer: PRIVATE HEALTH INSURANCE | Admitting: Nurse Practitioner

## 2024-02-20 VITALS — BP 126/79 | HR 60 | Temp 98.0°F | Ht 65.0 in | Wt 232.0 lb

## 2024-02-20 DIAGNOSIS — Z6838 Body mass index (BMI) 38.0-38.9, adult: Secondary | ICD-10-CM

## 2024-02-20 DIAGNOSIS — R7303 Prediabetes: Secondary | ICD-10-CM | POA: Diagnosis not present

## 2024-02-20 DIAGNOSIS — E66812 Obesity, class 2: Secondary | ICD-10-CM | POA: Diagnosis not present

## 2024-02-20 MED ORDER — PHENTERMINE HCL 8 MG PO TABS: ORAL_TABLET | ORAL | 0 refills | Status: AC

## 2024-02-20 NOTE — Progress Notes (Signed)
 Office: [phone]  /  Fax: [phone]  WEIGHT SUMMARY AND BIOMETRICS  Weight Lost Since Last Visit: 0lb  Weight Gained Since Last Visit: 6lb   Vitals Temp: 98 F (36.7 C) BP: 126/79 Pulse Rate: 60 SpO2: 100 %   Anthropometric Measurements Height: 5' 5 (1.651 m) Weight: 232 lb (105.2 kg) BMI (Calculated): 38.61 Weight at Last Visit: 226lb Weight Lost Since Last Visit: 0lb Weight Gained Since Last Visit: 6lb Starting Weight: 246lb Total Weight Loss (lbs): 14 lb (6.35 kg)   Body Composition  Body Fat %: 43.8 % Fat Mass (lbs): 102 lbs Muscle Mass (lbs): 124.2 lbs Total Body Water (lbs): 86.4 lbs Visceral Fat Rating : 9   Other Clinical Data Fasting: Yes Labs: No Today's Visit #: 12 Starting Date: 10/07/22     HPI  Chief Complaint: OBESITY  Jodi Gilbert is here to discuss her progress with her obesity treatment plan. She is on the practicing portion control and making smarter food choices, such as increasing vegetables and decreasing simple carbohydrates and states she is following her eating plan approximately 50 % of the time. She states she is exercising 0 minutes 0 days per week.   Interval History:  Since last office visit on 10/11/23 she has gained 6 pounds.  She is not tracking or following her meal plan.  She tries to watch her portion size half of the time.  She notes it has been a crazy semester at school.  She feels that she needs to be more motivated and making better food choices. She is cooking at home and eating on campus.  She is drinking water daily.  She is not currently exercising.  She will be home from school until January.  She is planning to start walking more.     Pharmacotherapy for weight loss: She is not currently taking medications  for medical weight loss.    Previous pharmacotherapy for medical weight loss:  [name]  (stopped because she states she is not good with taking pills daily) and Metformin  (stopped due to side effects of  diarrhea).   Bariatric surgery:  Patient has not had bariatric surgery.     Prediabetes Last A1c was 5.3-improved  Medication(s): none-took Metformin  in the past and stopped due to side effects of diarrhea.   Polyphagia:Yes Lab Results  Component Value Date   HGBA1C 5.3 07/18/2023   HGBA1C 5.8 (H) 03/17/2023   HGBA1C 6.3 (H) 10/07/2022   Lab Results  Component Value Date   INSULIN  16.9 07/18/2023   INSULIN  38.6 (H) 03/17/2023   INSULIN  26.3 (H) 10/07/2022     PHYSICAL EXAM:  Blood pressure 126/79, pulse 60, temperature 98 F (36.7 C), height 5' 5 (1.651 m), weight 232 lb (105.2 kg), SpO2 100%. Body mass index is 38.61 kg/m.  General: She is overweight, cooperative, alert, well developed, and in no acute distress. PSYCH: Has normal mood, affect and thought process.   Extremities: No edema.  Neurologic: No gross sensory or motor deficits. No tremors or fasciculations noted.    DIAGNOSTIC DATA REVIEWED:  BMET    Component Value Date/Time   NA 140 09/06/2023 1319   K 5.0 09/06/2023 1319   CL 103 09/06/2023 1319   CO2 19 (L) 09/06/2023 1319   GLUCOSE 89 09/06/2023 1319   BUN 11 09/06/2023 1319   CREATININE 0.75 09/06/2023 1319   CALCIUM 9.5 09/06/2023 1319   Lab Results  Component Value Date   HGBA1C 5.3 07/18/2023   HGBA1C 6.3 (H) 10/07/2022  Lab Results  Component Value Date   INSULIN  16.9 07/18/2023   INSULIN  26.3 (H) 10/07/2022   Lab Results  Component Value Date   TSH 2.910 10/07/2022   CBC    Component Value Date/Time   WBC 7.2 10/06/2023 1145   RBC 5.28 (H) 10/06/2023 1145   RBC 5.25 (H) 10/06/2023 1145   HGB 12.6 10/06/2023 1145   HGB 12.9 07/18/2023 0956   HCT 40.3 10/06/2023 1145   HCT 42.5 07/18/2023 0956   PLT 341 10/06/2023 1145   PLT 192 07/18/2023 0956   MCV 76.3 (L) 10/06/2023 1145   MCV 76 (L) 07/18/2023 0956   MCH 23.9 (L) 10/06/2023 1145   MCHC 31.3 10/06/2023 1145   RDW 15.6 (H) 10/06/2023 1145   RDW 19.2 (H) 07/18/2023  0956   Iron  Studies    Component Value Date/Time   IRON  24 (L) 10/06/2023 1145   IRON  32 07/18/2023 0956   TIBC 388 10/06/2023 1145   TIBC 375 07/18/2023 0956   FERRITIN 30 10/06/2023 1145   FERRITIN 88 07/18/2023 0956   IRONPCTSAT 6 (L) 10/06/2023 1145   IRONPCTSAT 9 (LL) 07/18/2023 0956   Lipid Panel     Component Value Date/Time   CHOL 110 10/07/2022 0930   TRIG 68 10/07/2022 0930   HDL 40 10/07/2022 0930   LDLCALC 56 10/07/2022 0930   Hepatic Function Panel     Component Value Date/Time   PROT 7.0 09/06/2023 1319   ALBUMIN 4.4 09/06/2023 1319   AST 13 09/06/2023 1319   ALT 15 09/06/2023 1319   ALKPHOS 91 09/06/2023 1319   BILITOT 0.4 09/06/2023 1319      Component Value Date/Time   TSH 2.910 10/07/2022 0930   Nutritional Lab Results  Component Value Date   VD25OH 35.0 03/17/2023   VD25OH 40.3 10/07/2022     ASSESSMENT AND PLAN  TREATMENT PLAN FOR OBESITY:  Recommended Dietary Goals  Jodi Gilbert is currently in the action stage of change. As such, her goal is to continue weight management plan. She has agreed to practicing portion control and making smarter food choices, such as increasing vegetables and decreasing simple carbohydrates.  Behavioral Intervention  We discussed the following Behavioral Modification Strategies today: increasing lean protein intake to established goals, decreasing simple carbohydrates , increasing vegetables, increasing fiber rich foods, increasing water intake , work on meal planning and preparation, work on tracking and journaling calories using tracking application, reading food labels , keeping healthy foods at home, celebration eating strategies, continue to work on maintaining a reduced calorie state, getting the recommended amount of protein, incorporating whole foods, making healthy choices, staying well hydrated and practicing mindfulness when eating., and increase protein intake, fibrous foods (25 grams per day for women, 30  grams for men) and water to improve satiety and decrease hunger signals. .  Additional resources provided today: NA  Recommended Physical Activity Goals  Jodi Gilbert has been advised to work up to 150 minutes of moderate intensity aerobic activity a week and strengthening exercises 2-3 times per week for cardiovascular health, weight loss maintenance and preservation of muscle mass.   She has agreed to Think about enjoyable ways to increase daily physical activity and overcoming barriers to exercise, Increase physical activity in their day and reduce sedentary time (increase NEAT)., Work on scheduling and tracking physical activity. , Increase volume of physical activity to a goal of 240 minutes a week, and Combine aerobic and strengthening exercises for efficiency and improved cardiometabolic health.  Pharmacotherapy We discussed various medication options to help Giara with her weight loss efforts and we both agreed to restart [name]  8mg .  Side effects discussed.  ASSOCIATED CONDITIONS ADDRESSED TODAY  Action/Plan  Prediabetes Will obtain labs at next visit  Obesity, Class II, BMI 35-39.9, no comorbidity -     Phentermine  HCl; Take 1 tablet at 12:00 noon  Dispense: 30 tablet; Refill: 0. Side effects discussed.           Return in about 4 weeks (around 03/19/2024).Jodi Gilbert She was informed of the importance of frequent follow up visits to maximize her success with intensive lifestyle modifications for her multiple health conditions.   ATTESTASTION STATEMENTS:  Reviewed by clinician on day of visit: allergies, medications, problem list, medical history, surgical history, family history, social history, and previous encounter notes.     Corean SAUNDERS. Ryenne Lynam FNP-C

## 2024-02-27 ENCOUNTER — Inpatient Hospital Stay: Payer: PRIVATE HEALTH INSURANCE | Admitting: Medical Oncology

## 2024-02-27 ENCOUNTER — Inpatient Hospital Stay: Payer: PRIVATE HEALTH INSURANCE | Attending: Pediatrics

## 2024-03-07 ENCOUNTER — Ambulatory Visit (INDEPENDENT_AMBULATORY_CARE_PROVIDER_SITE_OTHER): Payer: PRIVATE HEALTH INSURANCE | Admitting: Nurse Practitioner

## 2024-03-07 ENCOUNTER — Encounter: Payer: Self-pay | Admitting: Nurse Practitioner

## 2024-03-07 VITALS — BP 129/77 | HR 61 | Temp 97.6°F | Ht 65.0 in | Wt 232.0 lb

## 2024-03-07 DIAGNOSIS — E66812 Obesity, class 2: Secondary | ICD-10-CM

## 2024-03-07 DIAGNOSIS — Z6838 Body mass index (BMI) 38.0-38.9, adult: Secondary | ICD-10-CM

## 2024-03-07 DIAGNOSIS — R7303 Prediabetes: Secondary | ICD-10-CM

## 2024-03-07 NOTE — Progress Notes (Signed)
 "  Office: [phone]  /  Fax: [phone]  WEIGHT SUMMARY AND BIOMETRICS  Weight Lost Since Last Visit: 0lb  Weight Gained Since Last Visit: 0lb   Vitals Temp: 97.6 F (36.4 C) BP: 129/77 Pulse Rate: 61 SpO2: 97 %   Anthropometric Measurements Height: 5' 5 (1.651 m) Weight: 232 lb (105.2 kg) BMI (Calculated): 38.61 Weight at Last Visit: 232lb Weight Lost Since Last Visit: 0lb Weight Gained Since Last Visit: 0lb Starting Weight: 246lb Total Weight Loss (lbs): 14 lb (6.35 kg)   Body Composition  Body Fat %: 44 % Fat Mass (lbs): 102 lbs Muscle Mass (lbs): 123.4 lbs Total Body Water (lbs): 88 lbs Visceral Fat Rating : 9   Other Clinical Data Fasting: No Labs: No Today's Visit #: 13 Starting Date: 10/07/22     HPI  Chief Complaint: OBESITY  Aujanae is here to discuss her progress with her obesity treatment plan. She is on the practicing portion control and making smarter food choices, such as increasing vegetables and decreasing simple carbohydrates and states she is following her eating plan approximately 60 % of the time. She states she is exercising 90 minutes 3 days per week.   Interval History:  Since last office visit she has maintained her weight.  She notes she did got off track over the holidays.  Her goals in the new year are to move more and make better food choices.  She is drinking water daily.  Denies sugary drinks.  She is walking to stay active.    Pharmacotherapy for weight loss: She is not currently taking medications  for medical weight loss.  I had prescribed her [name]  after her last visit.  She didn't start it yet.  She is planning to pick up before she goes back to college at [employer].     Previous pharmacotherapy for medical weight loss:  [name]  (stopped because she states she is not good with taking pills daily) and Metformin  (stopped due to side effects of diarrhea).    Bariatric surgery:  Patient has not had bariatric  surgery.   Prediabetes Last A1c was 5.3 on 07/18/23  Medication(s): she is not currently on meds.  She took Metformin  in the past and stopped due to side effects of diarrhea.   Polyphagia:Yes Lab Results  Component Value Date   HGBA1C 5.3 07/18/2023   HGBA1C 5.8 (H) 03/17/2023   HGBA1C 6.3 (H) 10/07/2022   Lab Results  Component Value Date   INSULIN  16.9 07/18/2023   INSULIN  38.6 (H) 03/17/2023   INSULIN  26.3 (H) 10/07/2022    PHYSICAL EXAM:  Blood pressure 129/77, pulse 61, temperature 97.6 F (36.4 C), height 5' 5 (1.651 m), weight 232 lb (105.2 kg), last menstrual period 02/29/2024, SpO2 97%. Body mass index is 38.61 kg/m.  General: She is overweight, cooperative, alert, well developed, and in no acute distress. PSYCH: Has normal mood, affect and thought process.   Extremities: No edema.  Neurologic: No gross sensory or motor deficits. No tremors or fasciculations noted.    DIAGNOSTIC DATA REVIEWED:  BMET    Component Value Date/Time   NA 140 09/06/2023 1319   K 5.0 09/06/2023 1319   CL 103 09/06/2023 1319   CO2 19 (L) 09/06/2023 1319   GLUCOSE 89 09/06/2023 1319   BUN 11 09/06/2023 1319   CREATININE 0.75 09/06/2023 1319   CALCIUM 9.5 09/06/2023 1319   Lab Results  Component Value Date   HGBA1C 5.3 07/18/2023   HGBA1C 6.3 (H) 10/07/2022  Lab Results  Component Value Date   INSULIN  16.9 07/18/2023   INSULIN  26.3 (H) 10/07/2022   Lab Results  Component Value Date   TSH 2.910 10/07/2022   CBC    Component Value Date/Time   WBC 7.2 10/06/2023 1145   RBC 5.28 (H) 10/06/2023 1145   RBC 5.25 (H) 10/06/2023 1145   HGB 12.6 10/06/2023 1145   HGB 12.9 07/18/2023 0956   HCT 40.3 10/06/2023 1145   HCT 42.5 07/18/2023 0956   PLT 341 10/06/2023 1145   PLT 192 07/18/2023 0956   MCV 76.3 (L) 10/06/2023 1145   MCV 76 (L) 07/18/2023 0956   MCH 23.9 (L) 10/06/2023 1145   MCHC 31.3 10/06/2023 1145   RDW 15.6 (H) 10/06/2023 1145   RDW 19.2 (H) 07/18/2023  0956   Iron  Studies    Component Value Date/Time   IRON  24 (L) 10/06/2023 1145   IRON  32 07/18/2023 0956   TIBC 388 10/06/2023 1145   TIBC 375 07/18/2023 0956   FERRITIN 30 10/06/2023 1145   FERRITIN 88 07/18/2023 0956   IRONPCTSAT 6 (L) 10/06/2023 1145   IRONPCTSAT 9 (LL) 07/18/2023 0956   Lipid Panel     Component Value Date/Time   CHOL 110 10/07/2022 0930   TRIG 68 10/07/2022 0930   HDL 40 10/07/2022 0930   LDLCALC 56 10/07/2022 0930   Hepatic Function Panel     Component Value Date/Time   PROT 7.0 09/06/2023 1319   ALBUMIN 4.4 09/06/2023 1319   AST 13 09/06/2023 1319   ALT 15 09/06/2023 1319   ALKPHOS 91 09/06/2023 1319   BILITOT 0.4 09/06/2023 1319      Component Value Date/Time   TSH 2.910 10/07/2022 0930   Nutritional Lab Results  Component Value Date   VD25OH 35.0 03/17/2023   VD25OH 40.3 10/07/2022     ASSESSMENT AND PLAN  TREATMENT PLAN FOR OBESITY:  Recommended Dietary Goals  Destany is currently in the action stage of change. As such, her goal is to continue weight management plan. She has agreed to keeping a food journal and adhering to recommended goals of 1500 calories and 80+ grams of protein.  Behavioral Intervention  We discussed the following Behavioral Modification Strategies today: increasing lean protein intake to established goals, decreasing simple carbohydrates , increasing vegetables, increasing fiber rich foods, work on meal planning and preparation, work on tracking and journaling calories using tracking application, reading food labels , keeping healthy foods at home, planning for success, continue to work on maintaining a reduced calorie state, getting the recommended amount of protein, incorporating whole foods, making healthy choices, staying well hydrated and practicing mindfulness when eating., and increase protein intake, fibrous foods (25 grams per day for women, 30 grams for men) and water to improve satiety and decrease  hunger signals. .  Additional resources provided today: NA  Recommended Physical Activity Goals  Guila has been advised to work up to 150 minutes of moderate intensity aerobic activity a week and strengthening exercises 2-3 times per week for cardiovascular health, weight loss maintenance and preservation of muscle mass.   She has agreed to Think about enjoyable ways to increase daily physical activity and overcoming barriers to exercise, Increase physical activity in their day and reduce sedentary time (increase NEAT)., Work on scheduling and tracking physical activity. , Continue to gradually increase the amount and intensity of exercise routine, Increase volume of physical activity to a goal of 240 minutes a week, and Combine aerobic and strengthening exercises  for efficiency and improved cardiometabolic health.  ASSOCIATED CONDITIONS ADDRESSED TODAY  Action/Plan  Prediabetes Eulogia will continue to work on weight loss, exercise, and decreasing simple carbohydrates to help decrease the risk of diabetes.    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  Side effects discussed.  I advised the patient to send me a MyChart message to let me know how she is doing a week after she start taking [name] .  Discussed strategies such as putting her [name]  with her toothbrush to help remember to take it daily.     Return in about 6 weeks (around 04/18/2024).SABRA She was informed of the importance of frequent follow up visits to maximize her success with intensive lifestyle modifications for her multiple health conditions.   ATTESTASTION STATEMENTS:  Reviewed by clinician on day of visit: allergies, medications, problem list, medical history, surgical history, family history, social history, and previous encounter notes.    I personally spent a total of 23 minutes in the care of the patient today including preparing to see the patient, getting/reviewing separately obtained  history, performing a medically appropriate exam/evaluation, counseling and educating, and documenting clinical information in the EHR.   Corean SAUNDERS. Juanangel Soderholm FNP-C "

## 2024-05-10 ENCOUNTER — Ambulatory Visit: Payer: PRIVATE HEALTH INSURANCE | Admitting: Bariatrics
